# Patient Record
Sex: Female | Born: 1995 | Race: Black or African American | Hispanic: No | Marital: Single | Smoking: Never smoker
Health system: Southern US, Community
[De-identification: ages and names within clinical notes are randomized; demographics above are authoritative.]

## PROBLEM LIST (undated history)

## (undated) DIAGNOSIS — R87629 Unspecified abnormal cytological findings in specimens from vagina: Secondary | ICD-10-CM

## (undated) DIAGNOSIS — F32A Depression, unspecified: Secondary | ICD-10-CM

## (undated) DIAGNOSIS — B009 Herpesviral infection, unspecified: Secondary | ICD-10-CM

## (undated) DIAGNOSIS — F419 Anxiety disorder, unspecified: Secondary | ICD-10-CM

## (undated) DIAGNOSIS — F329 Major depressive disorder, single episode, unspecified: Secondary | ICD-10-CM

## (undated) DIAGNOSIS — Z8659 Personal history of other mental and behavioral disorders: Secondary | ICD-10-CM

## (undated) DIAGNOSIS — Z8742 Personal history of other diseases of the female genital tract: Secondary | ICD-10-CM

## (undated) HISTORY — DX: Unspecified abnormal cytological findings in specimens from vagina: R87.629

## (undated) HISTORY — PX: NO PAST SURGERIES: SHX2092

## (undated) HISTORY — DX: Depression, unspecified: F32.A

## (undated) HISTORY — DX: Anxiety disorder, unspecified: F41.9

---
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
## 2013-10-07 LAB — OB RESULTS CONSOLE RPR: RPR: NONREACTIVE

## 2013-10-07 LAB — OB RESULTS CONSOLE GC/CHLAMYDIA
Chlamydia: NEGATIVE
Gonorrhea: NEGATIVE

## 2013-10-07 LAB — OB RESULTS CONSOLE HIV ANTIBODY (ROUTINE TESTING): HIV: NONREACTIVE

## 2013-10-07 LAB — OB RESULTS CONSOLE HEPATITIS B SURFACE ANTIGEN: HEP B S AG: NEGATIVE

## 2013-10-07 LAB — OB RESULTS CONSOLE ABO/RH: RH TYPE: POSITIVE

## 2013-10-07 LAB — OB RESULTS CONSOLE ANTIBODY SCREEN: Antibody Screen: NEGATIVE

## 2013-10-07 LAB — OB RESULTS CONSOLE RUBELLA ANTIBODY, IGM: Rubella: IMMUNE

## 2014-01-25 ENCOUNTER — Inpatient Hospital Stay (HOSPITAL_COMMUNITY): Admission: AD | Admit: 2014-01-25 | Payer: Self-pay | Source: Ambulatory Visit | Admitting: Obstetrics & Gynecology

## 2014-04-17 LAB — OB RESULTS CONSOLE GBS: GBS: NEGATIVE

## 2014-05-07 ENCOUNTER — Encounter (HOSPITAL_COMMUNITY): Payer: Self-pay | Admitting: *Deleted

## 2014-05-07 ENCOUNTER — Inpatient Hospital Stay (HOSPITAL_COMMUNITY): Payer: BLUE CROSS/BLUE SHIELD | Admitting: Anesthesiology

## 2014-05-07 ENCOUNTER — Inpatient Hospital Stay (HOSPITAL_COMMUNITY)
Admission: AD | Admit: 2014-05-07 | Discharge: 2014-05-09 | DRG: 775 | Disposition: A | Discharge status: Home / Self Care | Payer: BLUE CROSS/BLUE SHIELD | Source: Ambulatory Visit | Attending: Obstetrics and Gynecology | Admitting: Obstetrics and Gynecology

## 2014-05-07 DIAGNOSIS — O9989 Other specified diseases and conditions complicating pregnancy, childbirth and the puerperium: Secondary | ICD-10-CM | POA: Diagnosis present

## 2014-05-07 DIAGNOSIS — Z3A39 39 weeks gestation of pregnancy: Secondary | ICD-10-CM | POA: Diagnosis present

## 2014-05-07 DIAGNOSIS — O4290 Premature rupture of membranes, unspecified as to length of time between rupture and onset of labor, unspecified weeks of gestation: Secondary | ICD-10-CM | POA: Diagnosis present

## 2014-05-07 HISTORY — DX: Personal history of other diseases of the female genital tract: Z87.42

## 2014-05-07 HISTORY — DX: Personal history of other mental and behavioral disorders: Z86.59

## 2014-05-07 HISTORY — DX: Herpesviral infection, unspecified: B00.9

## 2014-05-07 LAB — CBC
HCT: 36.3 % (ref 36.0–46.0)
Hemoglobin: 12.3 g/dL (ref 12.0–15.0)
MCH: 30.7 pg (ref 26.0–34.0)
MCHC: 33.9 g/dL (ref 30.0–36.0)
MCV: 90.5 fL (ref 78.0–100.0)
Platelets: 171 10*3/uL (ref 150–400)
RBC: 4.01 MIL/uL (ref 3.87–5.11)
RDW: 14 % (ref 11.5–15.5)
WBC: 7.8 10*3/uL (ref 4.0–10.5)

## 2014-05-07 LAB — TYPE AND SCREEN
ABO/RH(D): O POS
Antibody Screen: NEGATIVE

## 2014-05-07 LAB — ABO/RH: ABO/RH(D): O POS

## 2014-05-07 MED ORDER — CITRIC ACID-SODIUM CITRATE 334-500 MG/5ML PO SOLN: 30.0000 mL | ORAL | Status: DC | PRN

## 2014-05-07 MED ORDER — ACETAMINOPHEN 325 MG PO TABS: 650.0000 mg | ORAL_TABLET | ORAL | Status: DC | PRN

## 2014-05-07 MED ORDER — PHENYLEPHRINE 40 MCG/ML (10ML) SYRINGE FOR IV PUSH (FOR BLOOD PRESSURE SUPPORT)
80.0000 ug | PREFILLED_SYRINGE | INTRAVENOUS | Status: DC | PRN
Start: 2014-05-07 — End: 2014-05-08
  Filled 2014-05-07: qty 20
  Filled 2014-05-07: qty 2

## 2014-05-07 MED ORDER — EPHEDRINE 5 MG/ML INJ
10.0000 mg | INTRAVENOUS | Status: DC | PRN
  Filled 2014-05-07: qty 2

## 2014-05-07 MED ORDER — DIPHENHYDRAMINE HCL 50 MG/ML IJ SOLN: 12.5000 mg | INTRAMUSCULAR | Status: DC | PRN

## 2014-05-07 MED ORDER — ONDANSETRON HCL 4 MG/2ML IJ SOLN: 4.0000 mg | Freq: Four times a day (QID) | INTRAMUSCULAR | Status: DC | PRN

## 2014-05-07 MED ORDER — OXYCODONE-ACETAMINOPHEN 5-325 MG PO TABS: 2.0000 | ORAL_TABLET | ORAL | Status: DC | PRN

## 2014-05-07 MED ORDER — OXYTOCIN BOLUS FROM INFUSION: 500.0000 mL | INTRAVENOUS | Status: DC

## 2014-05-07 MED ORDER — LIDOCAINE HCL (PF) 1 % IJ SOLN
30.0000 mL | INTRAMUSCULAR | Status: DC | PRN
  Filled 2014-05-07: qty 30

## 2014-05-07 MED ORDER — OXYCODONE-ACETAMINOPHEN 5-325 MG PO TABS: 1.0000 | ORAL_TABLET | ORAL | Status: DC | PRN

## 2014-05-07 MED ORDER — FENTANYL 2.5 MCG/ML BUPIVACAINE 1/10 % EPIDURAL INFUSION (WH - ANES)
INTRAMUSCULAR | Status: DC | PRN
  Administered 2014-05-07: 14 mL/h via EPIDURAL

## 2014-05-07 MED ORDER — LACTATED RINGERS IV SOLN: 500.0000 mL | Freq: Once | INTRAVENOUS | Status: DC

## 2014-05-07 MED ORDER — PHENYLEPHRINE 40 MCG/ML (10ML) SYRINGE FOR IV PUSH (FOR BLOOD PRESSURE SUPPORT)
80.0000 ug | PREFILLED_SYRINGE | INTRAVENOUS | Status: DC | PRN
  Filled 2014-05-07: qty 2

## 2014-05-07 MED ORDER — FENTANYL 2.5 MCG/ML BUPIVACAINE 1/10 % EPIDURAL INFUSION (WH - ANES)
14.0000 mL/h | INTRAMUSCULAR | Status: DC | PRN
  Administered 2014-05-07: 14 mL/h via EPIDURAL
  Filled 2014-05-07: qty 125

## 2014-05-07 MED ORDER — LACTATED RINGERS IV SOLN
500.0000 mL | INTRAVENOUS | Status: DC | PRN
  Administered 2014-05-07 (×2): 500 mL via INTRAVENOUS

## 2014-05-07 MED ORDER — OXYTOCIN 40 UNITS IN LACTATED RINGERS INFUSION - SIMPLE MED
62.5000 mL/h | INTRAVENOUS | Status: DC
  Administered 2014-05-08: 62.5 mL/h via INTRAVENOUS
  Filled 2014-05-07: qty 1000

## 2014-05-07 MED ORDER — LIDOCAINE HCL (PF) 1 % IJ SOLN
INTRAMUSCULAR | Status: DC | PRN
  Administered 2014-05-07 (×2): 8 mL

## 2014-05-07 MED ORDER — BUTORPHANOL TARTRATE 1 MG/ML IJ SOLN
1.0000 mg | INTRAMUSCULAR | Status: DC | PRN
  Administered 2014-05-07: 1 mg via INTRAVENOUS
  Filled 2014-05-07: qty 1

## 2014-05-07 MED ORDER — LACTATED RINGERS IV SOLN
INTRAVENOUS | Status: DC
  Administered 2014-05-07: 18:00:00 via INTRAVENOUS

## 2014-05-07 NOTE — Anesthesia Procedure Notes (Signed)
Epidural Patient location during procedure: OB Start time: 05/07/2014 9:23 PM End time: 05/07/2014 9:27 PM  Staffing Anesthesiologist: Leilani AbleHATCHETT, Kevron Patella Performed by: anesthesiologist   Preanesthetic Checklist Completed: patient identified, surgical consent, pre-op evaluation, timeout performed, IV checked, risks and benefits discussed and monitors and equipment checked  Epidural Patient position: sitting Prep: site prepped and draped and DuraPrep Patient monitoring: continuous pulse ox and blood pressure Approach: midline Location: L3-L4 Injection technique: LOR air  Needle:  Needle type: Tuohy  Needle gauge: 17 G Needle length: 9 cm and 9 Needle insertion depth: 6 cm Catheter type: closed end flexible Catheter size: 19 Gauge Catheter at skin depth: 11 cm Test dose: negative and Other  Assessment Sensory level: T9 Events: blood not aspirated, injection not painful, no injection resistance, negative IV test and no paresthesia  Additional Notes Reason for block:procedure for pain

## 2014-05-07 NOTE — MAU Note (Signed)
Pt reports her water broke around 1:30 pm today clear fluid. Denies contractions and reports good fetal movement.

## 2014-05-07 NOTE — Anesthesia Preprocedure Evaluation (Signed)

## 2014-05-08 ENCOUNTER — Encounter (HOSPITAL_COMMUNITY): Payer: Self-pay | Admitting: General Practice

## 2014-05-08 LAB — CBC
HCT: 33.7 % — ABNORMAL LOW (ref 36.0–46.0)
Hemoglobin: 11.4 g/dL — ABNORMAL LOW (ref 12.0–15.0)
MCH: 30.5 pg (ref 26.0–34.0)
MCHC: 33.8 g/dL (ref 30.0–36.0)
MCV: 90.1 fL (ref 78.0–100.0)
Platelets: 147 10*3/uL — ABNORMAL LOW (ref 150–400)
RBC: 3.74 MIL/uL — ABNORMAL LOW (ref 3.87–5.11)
RDW: 14.1 % (ref 11.5–15.5)
WBC: 18.4 10*3/uL — AB (ref 4.0–10.5)

## 2014-05-08 LAB — RPR: RPR Ser Ql: NONREACTIVE

## 2014-05-08 MED ORDER — SIMETHICONE 80 MG PO CHEW: 80.0000 mg | CHEWABLE_TABLET | ORAL | Status: DC | PRN

## 2014-05-08 MED ORDER — OXYCODONE-ACETAMINOPHEN 5-325 MG PO TABS
1.0000 | ORAL_TABLET | ORAL | Status: DC | PRN
  Administered 2014-05-08 – 2014-05-09 (×4): 1 via ORAL
  Filled 2014-05-08 (×4): qty 1

## 2014-05-08 MED ORDER — MEDROXYPROGESTERONE ACETATE 150 MG/ML IM SUSP
150.0000 mg | INTRAMUSCULAR | Status: DC | PRN
Start: 2014-05-08 — End: 2014-05-09

## 2014-05-08 MED ORDER — IBUPROFEN 600 MG PO TABS
600.0000 mg | ORAL_TABLET | Freq: Four times a day (QID) | ORAL | Status: DC
  Administered 2014-05-08 – 2014-05-09 (×7): 600 mg via ORAL
  Filled 2014-05-08 (×7): qty 1

## 2014-05-08 MED ORDER — SENNOSIDES-DOCUSATE SODIUM 8.6-50 MG PO TABS
2.0000 | ORAL_TABLET | ORAL | Status: DC
  Administered 2014-05-08: 2 via ORAL
  Filled 2014-05-08: qty 2

## 2014-05-08 MED ORDER — BENZOCAINE-MENTHOL 20-0.5 % EX AERO
1.0000 "application " | INHALATION_SPRAY | CUTANEOUS | Status: DC | PRN
  Administered 2014-05-08: 1 via TOPICAL
  Filled 2014-05-08: qty 56

## 2014-05-08 MED ORDER — ONDANSETRON HCL 4 MG PO TABS: 4.0000 mg | ORAL_TABLET | ORAL | Status: DC | PRN

## 2014-05-08 MED ORDER — TETANUS-DIPHTH-ACELL PERTUSSIS 5-2.5-18.5 LF-MCG/0.5 IM SUSP: 0.5000 mL | Freq: Once | INTRAMUSCULAR | Status: DC

## 2014-05-08 MED ORDER — LANOLIN HYDROUS EX OINT
TOPICAL_OINTMENT | CUTANEOUS | Status: DC | PRN
Start: 2014-05-08 — End: 2014-05-09

## 2014-05-08 MED ORDER — MEASLES, MUMPS & RUBELLA VAC ~~LOC~~ INJ
0.5000 mL | INJECTION | Freq: Once | SUBCUTANEOUS | Status: DC
  Filled 2014-05-08: qty 0.5

## 2014-05-08 MED ORDER — PRENATAL MULTIVITAMIN CH
1.0000 | ORAL_TABLET | Freq: Every day | ORAL | Status: DC
  Administered 2014-05-08 – 2014-05-09 (×2): 1 via ORAL
  Filled 2014-05-08 (×2): qty 1

## 2014-05-08 MED ORDER — OXYCODONE-ACETAMINOPHEN 5-325 MG PO TABS: 2.0000 | ORAL_TABLET | ORAL | Status: DC | PRN

## 2014-05-08 MED ORDER — DIBUCAINE 1 % RE OINT: 1.0000 "application " | TOPICAL_OINTMENT | RECTAL | Status: DC | PRN

## 2014-05-08 MED ORDER — WITCH HAZEL-GLYCERIN EX PADS: 1.0000 "application " | MEDICATED_PAD | CUTANEOUS | Status: DC | PRN

## 2014-05-08 MED ORDER — DIPHENHYDRAMINE HCL 25 MG PO CAPS: 25.0000 mg | ORAL_CAPSULE | Freq: Four times a day (QID) | ORAL | Status: DC | PRN

## 2014-05-08 MED ORDER — ONDANSETRON HCL 4 MG/2ML IJ SOLN: 4.0000 mg | INTRAMUSCULAR | Status: DC | PRN

## 2014-05-08 NOTE — Progress Notes (Signed)
Post Partum Day 0 Subjective: no complaints, up ad lib, voiding and tolerating PO  Objective: Blood pressure 99/57, pulse 106, temperature 98.3 F (36.8 C), temperature source Oral, resp. rate 18, height 5\' 1"  (1.549 m), weight 147 lb (66.679 kg), SpO2 100 %, unknown if currently breastfeeding.  Physical Exam:  General: alert and cooperative Lochia: appropriate Uterine Fundus: firm Incision: healing well DVT Evaluation: No evidence of DVT seen on physical exam. Negative Homan's sign. No cords or calf tenderness. No significant calf/ankle edema.   Recent Labs  05/07/14 1445 05/08/14 0600  HGB 12.3 11.4*  HCT 36.3 33.7*    Assessment/Plan: Plan for discharge tomorrow   LOS: 1 day   CURTIS,CAROL G 05/08/2014, 8:00 AM

## 2014-05-08 NOTE — Progress Notes (Signed)
SVD of vigerous female infant w/ apgars of 9,9.  Placenta delivered spontaneous w/ 3VC.   1st degree & right labial lac repaired w/ 3-0 vicryl rapide.  Fundus firm.  EBL 300cc .

## 2014-05-08 NOTE — H&P (Signed)
Elisha HeadlandJaquana Miller is a 19 y.o. female presenting for SROM, clear fluid.  Having regular ctx.  No vb.  Pregnancy uncomplicated.  History OB History    Gravida Para Term Preterm AB TAB SAB Ectopic Multiple Living   1         0     History reviewed. No pertinent past medical history. History reviewed. No pertinent past surgical history. Family History: family history is not on file. Social History:  reports that she has never smoked. She does not have any smokeless tobacco history on file. She reports that she does not use illicit drugs. Her alcohol history is not on file.   Prenatal Transfer Tool  Maternal Diabetes: No Genetic Screening: Normal Maternal Ultrasounds/Referrals: Normal Fetal Ultrasounds or other Referrals:  None Maternal Substance Abuse:  No Significant Maternal Medications:  None Significant Maternal Lab Results:  None Other Comments:  None  ROS  Dilation: 10 Effacement (%): 100 Station: +2 Exam by:: Lucas MallowKlashley, RN Blood pressure 115/52, pulse 105, temperature 98.3 F (36.8 C), temperature source Oral, resp. rate 16, height 5\' 1"  (1.549 m), weight 147 lb (66.679 kg), SpO2 100 %. Exam Physical Exam  Prenatal labs: ABO, Rh: --/--/O POS, O POS (02/25 1445) Antibody: NEG (02/25 1445) Rubella: Immune (07/28 0000) RPR: Nonreactive (07/28 0000)  HBsAg: Negative (07/28 0000)  HIV: Non-reactive (07/28 0000)  GBS: Negative (02/05 0000)   Assessment/Plan: SROM/Labor Exp mngt Epidural prn   Draken Farrior 05/08/2014, 12:54 AM

## 2014-05-08 NOTE — Progress Notes (Signed)
UR chart review completed.  

## 2014-05-08 NOTE — Anesthesia Postprocedure Evaluation (Signed)
  Anesthesia Post-op Note  Patient: Katherine Nunez  Procedure(s) Performed: * No procedures listed *  Patient Location: Mother/Baby  Anesthesia Type:Epidural  Level of Consciousness: awake, alert , oriented and patient cooperative  Airway and Oxygen Therapy: Patient Spontanous Breathing  Post-op Pain: none  Post-op Assessment: Post-op Vital signs reviewed, Patient's Cardiovascular Status Stable, Respiratory Function Stable, Patent Airway, No signs of Nausea or vomiting, Adequate PO intake, Pain level controlled, No headache, No backache, No residual numbness and No residual motor weakness  Post-op Vital Signs: Reviewed and stable  Last Vitals:  Filed Vitals:   05/08/14 0424  BP: 99/57  Pulse: 106  Temp: 36.8 C  Resp: 18    Complications: No apparent anesthesia complications

## 2014-05-09 MED ORDER — OXYCODONE-ACETAMINOPHEN 7.5-325 MG PO TABS: 1.0000 | ORAL_TABLET | ORAL | Status: DC | PRN

## 2014-05-09 NOTE — Discharge Summary (Signed)
Obstetric Discharge Summary Reason for Admission: rupture of membranes Prenatal Procedures: none Intrapartum Procedures: spontaneous vaginal delivery Postpartum Procedures: none Complications-Operative and Postpartum: first degree perineal laceration HEMOGLOBIN  Date Value Ref Range Status  05/08/2014 11.4* 12.0 - 15.0 g/dL Final   HCT  Date Value Ref Range Status  05/08/2014 33.7* 36.0 - 46.0 % Final    Physical Exam:  General: alert Lochia: appropriate Uterine Fundus: firm Incision: healing well DVT Evaluation: No evidence of DVT seen on physical exam.  Discharge Diagnoses: Term Pregnancy-delivered  Discharge Information: Date: 05/09/2014 Activity: pelvic rest Diet: routine Medications: PNV and Percocet Condition: stable Instructions: refer to practice specific booklet Discharge to: home   Newborn Data: Live born female  Birth Weight: 6 lb 15.5 oz (3160 g) APGAR: 9, 9  Home with mother.  Luna Audia S 05/09/2014, 9:03 AM

## 2014-05-09 NOTE — Progress Notes (Signed)
Clinical Social Work Department PSYCHOSOCIAL ASSESSMENT - MATERNAL/CHILD 05/09/2014  Patient:  Katherine Nunez,Katherine Nunez  Account Number:  [id number]  Admit Date:  05/07/2014  Childs Name:   Katherine Nunez    Clinical Social Worker:  Lavonia Eager, LCSW   Date/Time:  05/09/2014 12:30 PM  Date Referred:  05/08/2014   Referral source  Central Nursery     Referred reason  Panic attacks   Other referral source:    I:  FAMILY / HOME ENVIRONMENT Child's legal guardian:  PARENT  Guardian - Name Guardian - Age Guardian - Address  Katherine Nunez,Katherine Nunez 18 [address]  [place], [place] [address]  Katherine Nunez, Katherine Nunez 22 same as above   Other household support members/support persons Other support:   Grand parents    II  PSYCHOSOCIAL DATA Information Source:    Financial and Community Resources Employment:   Both parents are employed   Financial resources:  Private Insurance If Medicaid - County:    School / Grade:   Maternity Care Coordinator / Child Services Coordination / Early Interventions:  Cultural issues impacting care:    III  STRENGTHS Strengths  Supportive family/friends  Home prepared for Child (including basic supplies)  Adequate Resources   Strength comment:    IV  RISK FACTORS AND CURRENT PROBLEMS Current Problem:     Risk Factor & Current Problem Patient Issue Family Issue Risk Factor / Current Problem Comment   N N     V  SOCIAL WORK ASSESSMENT Acknowledged order for social work consult to assess mother's hx of panic attacks.   Met with both parents. They were pleasant and receptive to social work intervention.   Parents not married but reside together. FOB was very attentive to newborn during social work visit. Mother reports hx of anxiety and states that she was prescribed medication, but did not take any during pregnancy and rarely needed to take the medication prior to pregnancy.  She denies current symptoms of depression or anxiety.     She denies any hx of illicit drug use.    No acute social concerns noted or reported at this time. Mother informed of social work availability.      VI SOCIAL WORK PLAN Social Work Plan  No Further Intervention Required / No Barriers to Discharge   Type of pt/family education:   PP Depression information and resources   If child protective services report - county:   If child protective services report - date:   Information/referral to community resources comment:   Other social work plan:     

## 2014-05-13 ENCOUNTER — Inpatient Hospital Stay (HOSPITAL_COMMUNITY): Admission: RE | Admit: 2014-05-13 | Payer: BLUE CROSS/BLUE SHIELD | Source: Ambulatory Visit

## 2018-01-15 LAB — OB RESULTS CONSOLE ABO/RH: RH Type: POSITIVE

## 2018-01-15 LAB — OB RESULTS CONSOLE RUBELLA ANTIBODY, IGM: Rubella: IMMUNE

## 2018-01-15 LAB — OB RESULTS CONSOLE GC/CHLAMYDIA
Chlamydia: NEGATIVE
Gonorrhea: NEGATIVE

## 2018-01-15 LAB — OB RESULTS CONSOLE ANTIBODY SCREEN: Antibody Screen: NEGATIVE

## 2018-01-15 LAB — OB RESULTS CONSOLE HIV ANTIBODY (ROUTINE TESTING): HIV: NONREACTIVE

## 2018-01-15 LAB — OB RESULTS CONSOLE RPR: RPR: NONREACTIVE

## 2018-01-15 LAB — OB RESULTS CONSOLE HEPATITIS B SURFACE ANTIGEN: Hepatitis B Surface Ag: NEGATIVE

## 2018-08-15 ENCOUNTER — Encounter (HOSPITAL_COMMUNITY): Payer: Self-pay | Admitting: *Deleted

## 2018-08-15 ENCOUNTER — Telehealth (HOSPITAL_COMMUNITY): Payer: Self-pay | Admitting: *Deleted

## 2018-08-15 NOTE — Telephone Encounter (Signed)
Preadmission screen  

## 2018-08-17 ENCOUNTER — Other Ambulatory Visit: Payer: Self-pay

## 2018-08-17 ENCOUNTER — Encounter (HOSPITAL_COMMUNITY): Payer: Self-pay

## 2018-08-17 ENCOUNTER — Inpatient Hospital Stay (HOSPITAL_COMMUNITY)
Admission: AD | Admit: 2018-08-17 | Discharge: 2018-08-18 | Disposition: A | Discharge status: Home / Self Care | Payer: 59 | Attending: Obstetrics and Gynecology | Admitting: Obstetrics and Gynecology

## 2018-08-17 DIAGNOSIS — B9689 Other specified bacterial agents as the cause of diseases classified elsewhere: Secondary | ICD-10-CM | POA: Diagnosis not present

## 2018-08-17 DIAGNOSIS — Z3A38 38 weeks gestation of pregnancy: Secondary | ICD-10-CM

## 2018-08-17 DIAGNOSIS — O26892 Other specified pregnancy related conditions, second trimester: Secondary | ICD-10-CM | POA: Diagnosis not present

## 2018-08-17 DIAGNOSIS — O23593 Infection of other part of genital tract in pregnancy, third trimester: Secondary | ICD-10-CM | POA: Diagnosis not present

## 2018-08-17 DIAGNOSIS — O26893 Other specified pregnancy related conditions, third trimester: Secondary | ICD-10-CM | POA: Diagnosis present

## 2018-08-17 DIAGNOSIS — Z0371 Encounter for suspected problem with amniotic cavity and membrane ruled out: Secondary | ICD-10-CM | POA: Diagnosis not present

## 2018-08-17 LAB — WET PREP, GENITAL
Sperm: NONE SEEN
Trich, Wet Prep: NONE SEEN
Yeast Wet Prep HPF POC: NONE SEEN

## 2018-08-17 LAB — POCT FERN TEST: POCT Fern Test: NEGATIVE

## 2018-08-17 NOTE — MAU Note (Signed)
Pt has been having cntrx about an hour apart.  Has been leaking fluid since Thursday. She was 3 cm on Wednesday. Denies bleeding. +FM

## 2018-08-17 NOTE — MAU Provider Note (Signed)
None      S: Ms. Katherine Nunez is a 23 y.o. G2P1001 at [redacted]w[redacted]d  who presents to MAU today complaining of leaking of fluid since Thursday.  Patient states she has not been wearing a pad, but has been changing her underwear throughout the day.  She denies vaginal bleeding. She endorses contractions. She reports normal fetal movement.    O: BP 110/66 (BP Location: Right Arm)   Pulse (!) 109   Temp 98.4 F (36.9 C) (Oral)   Ht 5\' 2"  (1.575 m)   Wt 70.7 kg   LMP 11/20/2017   SpO2 99%   BMI 28.50 kg/m  GENERAL: Well-developed, well-nourished female in no acute distress.  HEAD: Normocephalic, atraumatic.  CHEST: Normal effort of breathing, regular heart rate ABDOMEN: Soft, nontender, gravid PELVIC: Normal external female genitalia. Vagina is pink and rugated. Cervix difficult to visualize s/t position and mucoid discharge.  + pooling of grayish discharge noted in posterior fornix. Fern Collected  Cervical exam:  Dilation: 1.5 Effacement (%): 10 Cervical Position: Posterior Station: (indeterminate) Exam by:: TLytle Rn    Fetal Monitoring: FHT: 145 bpm, Mod Var, -Decels, +Accels Toco: Q1-39min  Results for orders placed or performed during the hospital encounter of 08/17/18 (from the past 24 hour(s))  POCT fern test     Status: Normal   Collection Time: 08/17/18 10:49 PM  Result Value Ref Range   POCT Fern Test Negative = intact amniotic membranes   Wet prep, genital     Status: Abnormal   Collection Time: 08/17/18 11:41 PM  Result Value Ref Range   Yeast Wet Prep HPF POC NONE SEEN NONE SEEN   Trich, Wet Prep NONE SEEN NONE SEEN   Clue Cells Wet Prep HPF POC PRESENT (A) NONE SEEN   WBC, Wet Prep HPF POC MODERATE (A) NONE SEEN   Sperm NONE SEEN      A: SIUP at [redacted]w[redacted]d  Membranes intact  P: -Fern Negative -Wet prep collected and sent. -NST Reactive -Will reassess  Follow Up (1:08 AM) Bacterial Vaginosis Dilation: 1.5 Effacement (%): 50 Cervical Position:  Posterior Station: Ballotable Presentation: Vertex Exam by:: Frances Maywood RN   -Wet prep returns significant for clue cells. -Cervical exam remains the same. -Results discussed with patient. -Rx for Metronidazole sent to pharmacy on file.  -Patient informed that regime would not be completed prior to scheduled IOL. -Informed that this would not have an effect on delivery. -Patient without questions or concerns. -Labor Precautions given. -Encouraged to call or return to MAU if symptoms worsen or with the onset of new symptoms. -Discharged to home in stable condition.  Katherine Nunez, CNM 08/17/2018 11:16 PM

## 2018-08-18 DIAGNOSIS — Z3A38 38 weeks gestation of pregnancy: Secondary | ICD-10-CM

## 2018-08-18 DIAGNOSIS — O26893 Other specified pregnancy related conditions, third trimester: Secondary | ICD-10-CM

## 2018-08-18 DIAGNOSIS — O23593 Infection of other part of genital tract in pregnancy, third trimester: Secondary | ICD-10-CM | POA: Diagnosis not present

## 2018-08-18 DIAGNOSIS — Z0371 Encounter for suspected problem with amniotic cavity and membrane ruled out: Secondary | ICD-10-CM

## 2018-08-18 DIAGNOSIS — B9689 Other specified bacterial agents as the cause of diseases classified elsewhere: Secondary | ICD-10-CM

## 2018-08-18 MED ORDER — METRONIDAZOLE 500 MG PO TABS: 500.0000 mg | ORAL_TABLET | Freq: Two times a day (BID) | ORAL | 0 refills | Status: DC

## 2018-08-18 NOTE — MAU Note (Signed)
I have communicated with  Milinda Cave, CNM and reviewed vital signs:  Vitals:   08/17/18 2315 08/18/18 0113  BP:  112/73  Pulse:    Temp:    SpO2: 99%     Vaginal exam:  Dilation: 1.5 Effacement (%): 50 Cervical Position: Posterior Station: Ballotable Presentation: Vertex Exam by:: Frances Maywood RN,   Also reviewed contraction pattern and that non-stress test is reactive.  It has been documented that patient is contracting every 2-6 minutes with minimal  cervical change over 1.25 hours not indicating active labor.  Patient denies any other complaints.  Based on this report provider has given order for discharge.  A discharge order and diagnosis entered by a provider.   Labor discharge instructions reviewed with patient.

## 2018-08-18 NOTE — Discharge Instructions (Signed)
Bacterial Vaginosis    Bacterial vaginosis is a vaginal infection that occurs when the normal balance of bacteria in the vagina is disrupted. It results from an overgrowth of certain bacteria. This is the most common vaginal infection among women ages 15-44.  Because bacterial vaginosis increases your risk for STIs (sexually transmitted infections), getting treated can help reduce your risk for chlamydia, gonorrhea, herpes, and HIV (human immunodeficiency virus). Treatment is also important for preventing complications in pregnant women, because this condition can cause an early (premature) delivery.  What are the causes?  This condition is caused by an increase in harmful bacteria that are normally present in small amounts in the vagina. However, the reason that the condition develops is not fully understood.  What increases the risk?  The following factors may make you more likely to develop this condition:  · Having a new sexual partner or multiple sexual partners.  · Having unprotected sex.  · Douching.  · Having an intrauterine device (IUD).  · Smoking.  · Drug and alcohol abuse.  · Taking certain antibiotic medicines.  · Being pregnant.  You cannot get bacterial vaginosis from toilet seats, bedding, swimming pools, or contact with objects around you.  What are the signs or symptoms?  Symptoms of this condition include:  · Grey or white vaginal discharge. The discharge can also be watery or foamy.  · A fish-like odor with discharge, especially after sexual intercourse or during menstruation.  · Itching in and around the vagina.  · Burning or pain with urination.  Some women with bacterial vaginosis have no signs or symptoms.  How is this diagnosed?  This condition is diagnosed based on:  · Your medical history.  · A physical exam of the vagina.  · Testing a sample of vaginal fluid under a microscope to look for a large amount of bad bacteria or abnormal cells. Your health care provider may use a cotton swab or  a small wooden spatula to collect the sample.  How is this treated?  This condition is treated with antibiotics. These may be given as a pill, a vaginal cream, or a medicine that is put into the vagina (suppository). If the condition comes back after treatment, a second round of antibiotics may be needed.  Follow these instructions at home:  Medicines  · Take over-the-counter and prescription medicines only as told by your health care provider.  · Take or use your antibiotic as told by your health care provider. Do not stop taking or using the antibiotic even if you start to feel better.  General instructions  · If you have a female sexual partner, tell her that you have a vaginal infection. She should see her health care provider and be treated if she has symptoms. If you have a female sexual partner, he does not need treatment.  · During treatment:  ? Avoid sexual activity until you finish treatment.  ? Do not douche.  ? Avoid alcohol as directed by your health care provider.  ? Avoid breastfeeding as directed by your health care provider.  · Drink enough water and fluids to keep your urine clear or pale yellow.  · Keep the area around your vagina and rectum clean.  ? Wash the area daily with warm water.  ? Wipe yourself from front to back after using the toilet.  · Keep all follow-up visits as told by your health care provider. This is important.  How is this prevented?  · Do not   douche.  · Wash the outside of your vagina with warm water only.  · Use protection when having sex. This includes latex condoms and dental dams.  · Limit how many sexual partners you have. To help prevent bacterial vaginosis, it is best to have sex with just one partner (monogamous).  · Make sure you and your sexual partner are tested for STIs.  · Wear cotton or cotton-lined underwear.  · Avoid wearing tight pants and pantyhose, especially during summer.  · Limit the amount of alcohol that you drink.  · Do not use any products that contain  nicotine or tobacco, such as cigarettes and e-cigarettes. If you need help quitting, ask your health care provider.  · Do not use illegal drugs.  Where to find more information  · Centers for Disease Control and Prevention: www.cdc.gov/std  · American Sexual Health Association (ASHA): www.ashastd.org  · U.S. Department of Health and Human Services, Office on Women's Health: www.womenshealth.gov/ or https://www.womenshealth.gov/a-z-topics/bacterial-vaginosis  Contact a health care provider if:  · Your symptoms do not improve, even after treatment.  · You have more discharge or pain when urinating.  · You have a fever.  · You have pain in your abdomen.  · You have pain during sex.  · You have vaginal bleeding between periods.  Summary  · Bacterial vaginosis is a vaginal infection that occurs when the normal balance of bacteria in the vagina is disrupted.  · Because bacterial vaginosis increases your risk for STIs (sexually transmitted infections), getting treated can help reduce your risk for chlamydia, gonorrhea, herpes, and HIV (human immunodeficiency virus). Treatment is also important for preventing complications in pregnant women, because the condition can cause an early (premature) delivery.  · This condition is treated with antibiotic medicines. These may be given as a pill, a vaginal cream, or a medicine that is put into the vagina (suppository).  This information is not intended to replace advice given to you by your health care provider. Make sure you discuss any questions you have with your health care provider.  Document Released: 02/27/2005 Document Revised: 07/03/2016 Document Reviewed: 11/13/2015  Elsevier Interactive Patient Education © 2019 [employer]

## 2018-08-20 ENCOUNTER — Other Ambulatory Visit (HOSPITAL_COMMUNITY)
Admission: RE | Admit: 2018-08-20 | Discharge: 2018-08-20 | Disposition: A | Discharge status: Home / Self Care | Payer: 59 | Source: Ambulatory Visit | Attending: Obstetrics and Gynecology | Admitting: Obstetrics and Gynecology

## 2018-08-20 ENCOUNTER — Other Ambulatory Visit: Payer: Self-pay

## 2018-08-20 DIAGNOSIS — Z1159 Encounter for screening for other viral diseases: Secondary | ICD-10-CM | POA: Diagnosis not present

## 2018-08-20 DIAGNOSIS — Z01812 Encounter for preprocedural laboratory examination: Secondary | ICD-10-CM | POA: Diagnosis present

## 2018-08-21 LAB — NOVEL CORONAVIRUS, NAA (HOSP ORDER, SEND-OUT TO REF LAB; TAT 18-24 HRS): SARS-CoV-2, NAA: NOT DETECTED

## 2018-08-21 NOTE — H&P (Signed)
Katherine Nunez is a 23 y.o. female presenting for eIOL. Pregnancy c/b [facility] that resolved. 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    OB History    Gravida  2   Para  1   Term  1   Preterm      AB      Living  1     SAB      TAB      Ectopic      Multiple  0   Live Births  1          Past Medical History:  Diagnosis Date  . Anxiety   . Depression   . History of abnormal cervical Pap smear   . History of anxiety   . History of panic attacks   . 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 Vaginal Pap smear, abnormal    Past Surgical History:  Procedure Laterality Date  . NO PAST SURGERIES     Family History: family history includes Diabetes in her father; Hypertension in her maternal grandfather and maternal grandmother. Social History:  reports that she has never smoked. She has never used smokeless tobacco. She reports previous alcohol use. She reports that she does not use drugs.     Maternal Diabetes: No Genetic Screening: Normal Maternal Ultrasounds/Referrals: Normal Fetal Ultrasounds or other Referrals:  None Maternal Substance Abuse:  No Significant Maternal Medications:  None Significant Maternal Lab Results:  None Other Comments:  None  ROS History   Last menstrual period 11/20/2017, unknown if currently breastfeeding. Exam Physical Exam  (from office) NAD, A&O NWOB Abd soft, nondistended, gravid  Prenatal labs: ABO, Rh: O/Positive/-- (11/05 0000) Antibody: Negative (11/05 0000) Rubella: Immune (11/05 0000) RPR: Nonreactive (11/05 0000)  HBsAg: Negative (11/05 0000)  HIV: Non-reactive (11/05 0000)  GBS:     Assessment/Plan: 23 yo G3P1011 @ 39.2wga presenting for eIOL.  Pitocin/AROM. Expecting another girl.  GBS negative.     Tyson Dense 08/21/2018, 12:29 PM

## 2018-08-22 ENCOUNTER — Other Ambulatory Visit: Payer: Self-pay

## 2018-08-22 ENCOUNTER — Inpatient Hospital Stay (HOSPITAL_COMMUNITY): Payer: 59

## 2018-08-22 ENCOUNTER — Encounter (HOSPITAL_COMMUNITY)
Admission: AD | Disposition: A | Discharge status: Home / Self Care | Payer: Self-pay | Source: Home / Self Care | Attending: Obstetrics and Gynecology

## 2018-08-22 ENCOUNTER — Inpatient Hospital Stay (HOSPITAL_COMMUNITY): Payer: 59 | Admitting: Anesthesiology

## 2018-08-22 ENCOUNTER — Inpatient Hospital Stay (HOSPITAL_COMMUNITY)
Admission: AD | Admit: 2018-08-22 | Discharge: 2018-08-24 | DRG: 788 | Disposition: A | Discharge status: Home / Self Care | Payer: 59 | Attending: Obstetrics and Gynecology | Admitting: Obstetrics and Gynecology

## 2018-08-22 ENCOUNTER — Encounter (HOSPITAL_COMMUNITY): Payer: Self-pay

## 2018-08-22 DIAGNOSIS — Z3490 Encounter for supervision of normal pregnancy, unspecified, unspecified trimester: Secondary | ICD-10-CM

## 2018-08-22 DIAGNOSIS — O26893 Other specified pregnancy related conditions, third trimester: Secondary | ICD-10-CM | POA: Diagnosis present

## 2018-08-22 DIAGNOSIS — Z3A39 39 weeks gestation of pregnancy: Secondary | ICD-10-CM | POA: Diagnosis not present

## 2018-08-22 LAB — CBC
HCT: 34.1 % — ABNORMAL LOW (ref 36.0–46.0)
Hemoglobin: 11.5 g/dL — ABNORMAL LOW (ref 12.0–15.0)
MCH: 30.5 pg (ref 26.0–34.0)
MCHC: 33.7 g/dL (ref 30.0–36.0)
MCV: 90.5 fL (ref 80.0–100.0)
Platelets: 172 10*3/uL (ref 150–400)
RBC: 3.77 MIL/uL — ABNORMAL LOW (ref 3.87–5.11)
RDW: 13.6 % (ref 11.5–15.5)
WBC: 7.3 10*3/uL (ref 4.0–10.5)
nRBC: 0 % (ref 0.0–0.2)

## 2018-08-22 LAB — TYPE AND SCREEN
ABO/RH(D): O POS
Antibody Screen: NEGATIVE

## 2018-08-22 LAB — RPR: RPR Ser Ql: NONREACTIVE

## 2018-08-22 LAB — ABO/RH: ABO/RH(D): O POS

## 2018-08-22 SURGERY — Surgical Case
Anesthesia: Epidural | Site: Abdomen | Wound class: Clean Contaminated

## 2018-08-22 MED ORDER — SODIUM CHLORIDE 0.9% FLUSH: 3.0000 mL | INTRAVENOUS | Status: DC | PRN

## 2018-08-22 MED ORDER — NALBUPHINE HCL 10 MG/ML IJ SOLN: 5.0000 mg | INTRAMUSCULAR | Status: DC | PRN

## 2018-08-22 MED ORDER — OXYCODONE-ACETAMINOPHEN 5-325 MG PO TABS: 1.0000 | ORAL_TABLET | ORAL | Status: DC | PRN

## 2018-08-22 MED ORDER — DIBUCAINE (PERIANAL) 1 % EX OINT: 1.0000 "application " | TOPICAL_OINTMENT | CUTANEOUS | Status: DC | PRN

## 2018-08-22 MED ORDER — COCONUT OIL OIL: 1.0000 "application " | TOPICAL_OIL | Status: DC | PRN

## 2018-08-22 MED ORDER — OXYTOCIN 40 UNITS IN NORMAL SALINE INFUSION - SIMPLE MED
INTRAVENOUS | Status: AC
  Filled 2018-08-22: qty 1000

## 2018-08-22 MED ORDER — KETOROLAC TROMETHAMINE 30 MG/ML IJ SOLN: 30.0000 mg | Freq: Four times a day (QID) | INTRAMUSCULAR | Status: DC | PRN

## 2018-08-22 MED ORDER — SIMETHICONE 80 MG PO CHEW
80.0000 mg | CHEWABLE_TABLET | ORAL | Status: DC
  Administered 2018-08-22 – 2018-08-23 (×2): 80 mg via ORAL
  Filled 2018-08-22 (×2): qty 1

## 2018-08-22 MED ORDER — LACTATED RINGERS IV SOLN
500.0000 mL | Freq: Once | INTRAVENOUS | Status: AC
  Administered 2018-08-22: 500 mL via INTRAVENOUS

## 2018-08-22 MED ORDER — NALOXONE HCL 4 MG/10ML IJ SOLN
1.0000 ug/kg/h | INTRAVENOUS | Status: DC | PRN
  Filled 2018-08-22: qty 5

## 2018-08-22 MED ORDER — NALBUPHINE HCL 10 MG/ML IJ SOLN: 5.0000 mg | Freq: Once | INTRAMUSCULAR | Status: DC | PRN

## 2018-08-22 MED ORDER — CEFAZOLIN SODIUM-DEXTROSE 2-4 GM/100ML-% IV SOLN
2.0000 g | Freq: Once | INTRAVENOUS | Status: AC
  Administered 2018-08-22: 2 g via INTRAVENOUS

## 2018-08-22 MED ORDER — OXYCODONE HCL 5 MG PO TABS
5.0000 mg | ORAL_TABLET | ORAL | Status: DC | PRN
  Administered 2018-08-23 – 2018-08-24 (×2): 5 mg via ORAL
  Administered 2018-08-24: 10 mg via ORAL
  Filled 2018-08-22 (×2): qty 1
  Filled 2018-08-22: qty 2

## 2018-08-22 MED ORDER — DEXAMETHASONE SODIUM PHOSPHATE 4 MG/ML IJ SOLN
INTRAMUSCULAR | Status: AC
  Filled 2018-08-22: qty 1

## 2018-08-22 MED ORDER — FENTANYL-BUPIVACAINE-NACL 0.5-0.125-0.9 MG/250ML-% EP SOLN
12.0000 mL/h | EPIDURAL | Status: DC | PRN
  Filled 2018-08-22: qty 250

## 2018-08-22 MED ORDER — OXYTOCIN BOLUS FROM INFUSION: 500.0000 mL | Freq: Once | INTRAVENOUS | Status: DC

## 2018-08-22 MED ORDER — DIPHENHYDRAMINE HCL 25 MG PO CAPS: 25.0000 mg | ORAL_CAPSULE | Freq: Four times a day (QID) | ORAL | Status: DC | PRN

## 2018-08-22 MED ORDER — SENNOSIDES-DOCUSATE SODIUM 8.6-50 MG PO TABS
2.0000 | ORAL_TABLET | ORAL | Status: DC
  Administered 2018-08-22 – 2018-08-23 (×2): 2 via ORAL
  Filled 2018-08-22 (×2): qty 2

## 2018-08-22 MED ORDER — FENTANYL CITRATE (PF) 100 MCG/2ML IJ SOLN
INTRAMUSCULAR | Status: AC
  Filled 2018-08-22: qty 2

## 2018-08-22 MED ORDER — PHENYLEPHRINE 40 MCG/ML (10ML) SYRINGE FOR IV PUSH (FOR BLOOD PRESSURE SUPPORT)
80.0000 ug | PREFILLED_SYRINGE | INTRAVENOUS | Status: DC | PRN
  Administered 2018-08-22: 80 ug via INTRAVENOUS

## 2018-08-22 MED ORDER — HYDROMORPHONE HCL 1 MG/ML IJ SOLN: 0.2000 mg | INTRAMUSCULAR | Status: DC | PRN

## 2018-08-22 MED ORDER — SOD CITRATE-CITRIC ACID 500-334 MG/5ML PO SOLN
30.0000 mL | ORAL | Status: DC | PRN
  Administered 2018-08-22 (×2): 30 mL via ORAL
  Filled 2018-08-22 (×2): qty 30

## 2018-08-22 MED ORDER — OXYTOCIN 10 UNIT/ML IJ SOLN
INTRAMUSCULAR | Status: DC | PRN
  Administered 2018-08-22: 40 [IU] via INTRAMUSCULAR

## 2018-08-22 MED ORDER — METOCLOPRAMIDE HCL 5 MG/ML IJ SOLN
INTRAMUSCULAR | Status: AC
  Filled 2018-08-22: qty 2

## 2018-08-22 MED ORDER — OXYTOCIN 40 UNITS IN NORMAL SALINE INFUSION - SIMPLE MED
1.0000 m[IU]/min | INTRAVENOUS | Status: DC
  Administered 2018-08-22: 2 m[IU]/min via INTRAVENOUS

## 2018-08-22 MED ORDER — LIDOCAINE-EPINEPHRINE (PF) 2 %-1:200000 IJ SOLN
INTRAMUSCULAR | Status: AC
  Filled 2018-08-22: qty 10

## 2018-08-22 MED ORDER — LACTATED RINGERS IV SOLN
500.0000 mL | INTRAVENOUS | Status: DC | PRN
  Administered 2018-08-22 (×2): 500 mL via INTRAVENOUS

## 2018-08-22 MED ORDER — MENTHOL 3 MG MT LOZG: 1.0000 | LOZENGE | OROMUCOSAL | Status: DC | PRN

## 2018-08-22 MED ORDER — DIPHENHYDRAMINE HCL 50 MG/ML IJ SOLN: 12.5000 mg | INTRAMUSCULAR | Status: DC | PRN

## 2018-08-22 MED ORDER — SODIUM CHLORIDE (PF) 0.9 % IJ SOLN
INTRAMUSCULAR | Status: DC | PRN
  Administered 2018-08-22: 14 mL/h via EPIDURAL

## 2018-08-22 MED ORDER — SIMETHICONE 80 MG PO CHEW: 80.0000 mg | CHEWABLE_TABLET | ORAL | Status: DC | PRN

## 2018-08-22 MED ORDER — MEPERIDINE HCL 25 MG/ML IJ SOLN: 6.2500 mg | INTRAMUSCULAR | Status: DC | PRN

## 2018-08-22 MED ORDER — ONDANSETRON HCL 4 MG/2ML IJ SOLN: 4.0000 mg | Freq: Three times a day (TID) | INTRAMUSCULAR | Status: DC | PRN

## 2018-08-22 MED ORDER — LACTATED RINGERS IV SOLN
INTRAVENOUS | Status: DC
  Administered 2018-08-22 (×2): via INTRAVENOUS

## 2018-08-22 MED ORDER — PRENATAL MULTIVITAMIN CH
1.0000 | ORAL_TABLET | Freq: Every day | ORAL | Status: DC
  Administered 2018-08-23 – 2018-08-24 (×2): 1 via ORAL
  Filled 2018-08-22 (×2): qty 1

## 2018-08-22 MED ORDER — WITCH HAZEL-GLYCERIN EX PADS: 1.0000 "application " | MEDICATED_PAD | CUTANEOUS | Status: DC | PRN

## 2018-08-22 MED ORDER — ACETAMINOPHEN 500 MG PO TABS
1000.0000 mg | ORAL_TABLET | Freq: Four times a day (QID) | ORAL | Status: DC
  Administered 2018-08-22 – 2018-08-24 (×7): 1000 mg via ORAL
  Filled 2018-08-22 (×7): qty 2

## 2018-08-22 MED ORDER — NALOXONE HCL 0.4 MG/ML IJ SOLN: 0.4000 mg | INTRAMUSCULAR | Status: DC | PRN

## 2018-08-22 MED ORDER — ONDANSETRON HCL 4 MG/2ML IJ SOLN
INTRAMUSCULAR | Status: DC | PRN
  Administered 2018-08-22: 4 mg via INTRAVENOUS

## 2018-08-22 MED ORDER — ACETAMINOPHEN 325 MG PO TABS
650.0000 mg | ORAL_TABLET | ORAL | Status: DC | PRN
  Administered 2018-08-22: 650 mg via ORAL
  Filled 2018-08-22: qty 2

## 2018-08-22 MED ORDER — MORPHINE SULFATE (PF) 0.5 MG/ML IJ SOLN
INTRAMUSCULAR | Status: AC
  Filled 2018-08-22: qty 10

## 2018-08-22 MED ORDER — DIPHENHYDRAMINE HCL 25 MG PO CAPS: 25.0000 mg | ORAL_CAPSULE | ORAL | Status: DC | PRN

## 2018-08-22 MED ORDER — OXYCODONE-ACETAMINOPHEN 5-325 MG PO TABS: 2.0000 | ORAL_TABLET | ORAL | Status: DC | PRN

## 2018-08-22 MED ORDER — LACTATED RINGERS IV SOLN
INTRAVENOUS | Status: DC | PRN
  Administered 2018-08-22: 17:00:00 via INTRAVENOUS

## 2018-08-22 MED ORDER — STERILE WATER FOR IRRIGATION IR SOLN
Status: DC | PRN
  Administered 2018-08-22: 1000 mL

## 2018-08-22 MED ORDER — FENTANYL CITRATE (PF) 100 MCG/2ML IJ SOLN
INTRAMUSCULAR | Status: DC | PRN
  Administered 2018-08-22 (×4): 25 ug via INTRAVENOUS

## 2018-08-22 MED ORDER — EPHEDRINE 5 MG/ML INJ: 10.0000 mg | INTRAVENOUS | Status: DC | PRN

## 2018-08-22 MED ORDER — PHENYLEPHRINE 40 MCG/ML (10ML) SYRINGE FOR IV PUSH (FOR BLOOD PRESSURE SUPPORT)
80.0000 ug | PREFILLED_SYRINGE | INTRAVENOUS | Status: DC | PRN
  Filled 2018-08-22: qty 10

## 2018-08-22 MED ORDER — TETANUS-DIPHTH-ACELL PERTUSSIS 5-2.5-18.5 LF-MCG/0.5 IM SUSP: 0.5000 mL | Freq: Once | INTRAMUSCULAR | Status: DC

## 2018-08-22 MED ORDER — ONDANSETRON HCL 4 MG/2ML IJ SOLN
INTRAMUSCULAR | Status: AC
  Filled 2018-08-22: qty 2

## 2018-08-22 MED ORDER — IBUPROFEN 800 MG PO TABS: 800.0000 mg | ORAL_TABLET | Freq: Four times a day (QID) | ORAL | Status: DC

## 2018-08-22 MED ORDER — ONDANSETRON HCL 4 MG/2ML IJ SOLN: 4.0000 mg | Freq: Four times a day (QID) | INTRAMUSCULAR | Status: DC | PRN

## 2018-08-22 MED ORDER — KETOROLAC TROMETHAMINE 30 MG/ML IJ SOLN
30.0000 mg | Freq: Four times a day (QID) | INTRAMUSCULAR | Status: DC | PRN
  Administered 2018-08-22: 30 mg via INTRAMUSCULAR

## 2018-08-22 MED ORDER — SODIUM CHLORIDE 0.9 % IV SOLN
INTRAVENOUS | Status: DC | PRN
  Administered 2018-08-22: 18:00:00 via INTRAVENOUS

## 2018-08-22 MED ORDER — SCOPOLAMINE 1 MG/3DAYS TD PT72
MEDICATED_PATCH | TRANSDERMAL | Status: DC | PRN
  Administered 2018-08-22: 1 via TRANSDERMAL

## 2018-08-22 MED ORDER — DEXAMETHASONE SODIUM PHOSPHATE 4 MG/ML IJ SOLN
INTRAMUSCULAR | Status: DC | PRN
  Administered 2018-08-22: 4 mg via INTRAVENOUS

## 2018-08-22 MED ORDER — OXYTOCIN 40 UNITS IN NORMAL SALINE INFUSION - SIMPLE MED: 2.5000 [IU]/h | INTRAVENOUS | Status: DC

## 2018-08-22 MED ORDER — KETOROLAC TROMETHAMINE 30 MG/ML IJ SOLN
INTRAMUSCULAR | Status: AC
  Filled 2018-08-22: qty 1

## 2018-08-22 MED ORDER — ZOLPIDEM TARTRATE 5 MG PO TABS: 5.0000 mg | ORAL_TABLET | Freq: Every evening | ORAL | Status: DC | PRN

## 2018-08-22 MED ORDER — LACTATED RINGERS IV SOLN
INTRAVENOUS | Status: DC
  Administered 2018-08-23: 02:00:00 via INTRAVENOUS

## 2018-08-22 MED ORDER — LIDOCAINE-EPINEPHRINE (PF) 2 %-1:200000 IJ SOLN
INTRAMUSCULAR | Status: DC | PRN
  Administered 2018-08-22 (×2): 3 mL via EPIDURAL
  Administered 2018-08-22 (×2): 5 mL via EPIDURAL

## 2018-08-22 MED ORDER — KETOROLAC TROMETHAMINE 30 MG/ML IJ SOLN
30.0000 mg | Freq: Four times a day (QID) | INTRAMUSCULAR | Status: DC
  Administered 2018-08-23 (×2): 30 mg via INTRAVENOUS
  Filled 2018-08-22 (×3): qty 1

## 2018-08-22 MED ORDER — METOCLOPRAMIDE HCL 5 MG/ML IJ SOLN
INTRAMUSCULAR | Status: DC | PRN
  Administered 2018-08-22: 10 mg via INTRAVENOUS

## 2018-08-22 MED ORDER — SCOPOLAMINE 1 MG/3DAYS TD PT72
MEDICATED_PATCH | TRANSDERMAL | Status: AC
  Filled 2018-08-22: qty 1

## 2018-08-22 MED ORDER — MORPHINE SULFATE (PF) 0.5 MG/ML IJ SOLN
INTRAMUSCULAR | Status: DC | PRN
  Administered 2018-08-22: 3 mg via EPIDURAL

## 2018-08-22 MED ORDER — SIMETHICONE 80 MG PO CHEW
80.0000 mg | CHEWABLE_TABLET | Freq: Three times a day (TID) | ORAL | Status: DC
  Administered 2018-08-23 – 2018-08-24 (×5): 80 mg via ORAL
  Filled 2018-08-22 (×5): qty 1

## 2018-08-22 MED ORDER — LIDOCAINE HCL (PF) 1 % IJ SOLN
30.0000 mL | INTRAMUSCULAR | Status: DC | PRN
Start: 2018-08-22 — End: 2018-08-22

## 2018-08-22 MED ORDER — OXYTOCIN 40 UNITS IN NORMAL SALINE INFUSION - SIMPLE MED: 2.5000 [IU]/h | INTRAVENOUS | Status: AC

## 2018-08-22 MED ORDER — PHENYLEPHRINE HCL (PRESSORS) 10 MG/ML IV SOLN
INTRAVENOUS | Status: DC | PRN
  Administered 2018-08-22: 40 ug via INTRAVENOUS

## 2018-08-22 MED ORDER — SODIUM CHLORIDE 0.9 % IR SOLN
Status: DC | PRN
  Administered 2018-08-22: 1000 mL

## 2018-08-22 MED ORDER — TERBUTALINE SULFATE 1 MG/ML IJ SOLN: 0.2500 mg | Freq: Once | INTRAMUSCULAR | Status: DC | PRN

## 2018-08-22 MED ORDER — FENTANYL CITRATE (PF) 100 MCG/2ML IJ SOLN: 25.0000 ug | INTRAMUSCULAR | Status: DC | PRN

## 2018-08-22 SURGICAL SUPPLY — 33 items
BENZOIN TINCTURE PRP APPL 2/3 (GAUZE/BANDAGES/DRESSINGS) ×2 IMPLANT
CHLORAPREP W/TINT 26ML (MISCELLANEOUS) ×2 IMPLANT
CLAMP CORD UMBIL (MISCELLANEOUS) ×2 IMPLANT
CLOTH BEACON ORANGE TIMEOUT ST (SAFETY) ×2 IMPLANT
DERMABOND ADVANCED (GAUZE/BANDAGES/DRESSINGS) ×1
DERMABOND ADVANCED .7 DNX12 (GAUZE/BANDAGES/DRESSINGS) ×1 IMPLANT
DRSG OPSITE POSTOP 4X10 (GAUZE/BANDAGES/DRESSINGS) ×2 IMPLANT
ELECT REM PT RETURN 9FT ADLT (ELECTROSURGICAL) ×2
ELECTRODE REM PT RTRN 9FT ADLT (ELECTROSURGICAL) ×1 IMPLANT
GLOVE BIO SURGEON STRL SZ 6.5 (GLOVE) ×2 IMPLANT
GLOVE BIOGEL PI IND STRL 6.5 (GLOVE) ×1 IMPLANT
GLOVE BIOGEL PI IND STRL 7.0 (GLOVE) ×2 IMPLANT
GLOVE BIOGEL PI INDICATOR 6.5 (GLOVE) ×1
GLOVE BIOGEL PI INDICATOR 7.0 (GLOVE) ×2
GOWN STRL REUS W/TWL LRG LVL3 (GOWN DISPOSABLE) ×4 IMPLANT
KIT ABG SYR 3ML LUER SLIP (SYRINGE) ×2 IMPLANT
NEEDLE HYPO 25X5/8 SAFETYGLIDE (NEEDLE) ×2 IMPLANT
NS IRRIG 1000ML POUR BTL (IV SOLUTION) ×2 IMPLANT
PACK C SECTION WH (CUSTOM PROCEDURE TRAY) ×2 IMPLANT
PAD ABD 8X7 1/2 STERILE (GAUZE/BANDAGES/DRESSINGS) ×2 IMPLANT
PAD OB MATERNITY 4.3X12.25 (PERSONAL CARE ITEMS) ×2 IMPLANT
PENCIL SMOKE EVAC W/HOLSTER (ELECTROSURGICAL) ×2 IMPLANT
SPONGE GAUZE 4X4 12PLY STER LF (GAUZE/BANDAGES/DRESSINGS) ×4 IMPLANT
STRIP CLOSURE SKIN 1/2X4 (GAUZE/BANDAGES/DRESSINGS) ×2 IMPLANT
SUT PLAIN 2 0 (SUTURE) ×1
SUT PLAIN ABS 2-0 CT1 27XMFL (SUTURE) ×1 IMPLANT
SUT VIC AB 0 CT1 36 (SUTURE) ×2 IMPLANT
SUT VIC AB 0 CTX 36 (SUTURE) ×3
SUT VIC AB 0 CTX36XBRD ANBCTRL (SUTURE) ×3 IMPLANT
SUT VIC AB 4-0 PS2 27 (SUTURE) ×2 IMPLANT
TAPE CLOTH SURG 4X10 WHT LF (GAUZE/BANDAGES/DRESSINGS) ×2 IMPLANT
TOWEL OR 17X24 6PK STRL BLUE (TOWEL DISPOSABLE) ×2 IMPLANT
WATER STERILE IRR 1000ML POUR (IV SOLUTION) ×2 IMPLANT

## 2018-08-22 NOTE — Anesthesia Procedure Notes (Signed)
Epidural Patient location during procedure: OB Start time: 08/22/2018 8:25 AM End time: 08/22/2018 8:40 AM  Staffing Anesthesiologist: Freddrick March, MD Performed: anesthesiologist   Preanesthetic Checklist Completed: patient identified, pre-op evaluation, timeout performed, IV checked, risks and benefits discussed and monitors and equipment checked  Epidural Patient position: sitting Prep: site prepped and draped and DuraPrep Patient monitoring: continuous pulse ox, blood pressure, heart rate and cardiac monitor Approach: midline Location: L3-L4 Injection technique: LOR air  Needle:  Needle type: Tuohy  Needle gauge: 17 G Needle length: 9 cm Needle insertion depth: 5 cm Catheter type: closed end flexible Catheter size: 19 Gauge Catheter at skin depth: 11 cm Test dose: negative  Assessment Sensory level: T8 Events: blood not aspirated, injection not painful, no injection resistance, negative IV test and no paresthesia  Additional Notes Patient identified. Risks/Benefits/Options discussed with patient including but not limited to bleeding, infection, nerve damage, paralysis, failed block, incomplete pain control, headache, blood pressure changes, nausea, vomiting, reactions to medication both or allergic, itching and postpartum back pain. Confirmed with bedside nurse the patient's most recent platelet count. Confirmed with patient that they are not currently taking any anticoagulation, have any bleeding history or any family history of bleeding disorders. Patient expressed understanding and wished to proceed. All questions were answered. Sterile technique was used throughout the entire procedure. Please see nursing notes for vital signs. Test dose was given through epidural catheter and negative prior to continuing to dose epidural or start infusion. Warning signs of high block given to the patient including shortness of breath, tingling/numbness in hands, complete motor block,  or any concerning symptoms with instructions to call for help. Patient was given instructions on fall risk and not to get out of bed. All questions and concerns addressed with instructions to call with any issues or inadequate analgesia.  Reason for block:procedure for pain

## 2018-08-22 NOTE — Transfer of Care (Signed)
Immediate Anesthesia Transfer of Care Note  Patient: Katherine Nunez  Procedure(s) Performed: CESAREAN SECTION (N/A Abdomen)  Patient Location: PACU  Anesthesia Type:Epidural  Level of Consciousness: awake, alert  and oriented  Airway & Oxygen Therapy: Patient Spontanous Breathing  Post-op Assessment: Report given to RN and Post -op Vital signs reviewed and stable  Post vital signs: Reviewed and stable  Last Vitals:  Vitals Value Taken Time  BP 102/61 08/22/18 1819  Temp 36.9 C 08/22/18 1822  Pulse 100 08/22/18 1828  Resp 20 08/22/18 1828  SpO2 100 % 08/22/18 1828  Vitals shown include unvalidated device data.  Last Pain:  Vitals:   08/22/18 1822  TempSrc: Oral  PainSc: 7       Patients Stated Pain Goal: 4 (93/[id number]/42 [id number])  Complications: No apparent anesthesia complications

## 2018-08-22 NOTE — Op Note (Signed)
PROCEDURE DATE: 08/22/18  PREOPERATIVE DIAGNOSIS: Intrauterine pregnancy at 39.2 wga, Indication: arrest of dilation, non-reassuring FHT  POSTOPERATIVE DIAGNOSIS:The same  PROCEDURE: Primary Low TransverseCesarean Section  SURGEON: Dr. Lucillie Garfinkel  INDICATIONS:This is a 23 yo G3P1011 at 21.2 wga requiring cesarean section secondary to arrest of dilation and nrFHT.  She progressed to 6.5cm but then made no further progression for >3-4 hours despite adequate contractions on IUPC. Caput forming. FHT with deep variables, slower to recover each time with dropping variability. Decision made to proceed with pLTCS.The risks of cesarean section discussed with the patient included but were not limited to: bleeding which may require transfusion or reoperation; infection which may require antibiotics; injury to bowel, bladder, ureters or other surrounding organs; injury to the fetus; need for additional procedures including hysterectomy in the event of a life-threatening hemorrhage; placental abnormalities wth subsequent pregnancies, incisional problems, thromboembolic phenomenon and other postoperative/anesthesia complications. The patient agreed with the proposed plan, giving informed consent for the procedure.   FINDINGS: Viable femaleinfant in vertex presentation,APGARs pending, Weight pending, Amniotic fluid clear, Intact placenta, three vessel cord. Grossly normal uterus, ovaries and fallopian tubes. .  ANESTHESIA: Epidural ESTIMATED BLOOD LOSS: pending SPECIMENS: Placenta for routine COMPLICATIONS: None immediate   PROCEDURE IN DETAIL: The patient received intravenous antibiotics (2g Ancef) and had sequential compression devices applied to her lower extremities while in the preoperative area. Shewasthen taken to the operating roomwhere epidural anesthesiawas dosed up to surgical level andwas found to be adequate. She was then placed in a dorsal supine position with a  leftward tilt,and prepped and draped in a sterile manner.A foley catheter was placed into her bladder and attached to constant gravity. After an adequate timeout was performed, aPfannenstiel skin incision was made with scalpel and carried through to the underlying layer of fascia. The fascia was incised in the midline and this incision was extended bilaterally using the Mayo scissors. Kocher clamps were applied to the superior aspect of the fascial incision and the underlying rectus muscles were dissected off bluntly. A similar process was carried out on the inferior aspect of the facial incision. The rectus muscles were separated in the midline bluntly and the peritoneum was entered bluntly. A bladder flap was created sharply and developed bluntly.Atransverse hysterotomy was made with a scalpel and extended bilaterally bluntly. The bladder blade was then removed. The infant was successfully delivered, and cord was clamped and cut and infant was handed over to awaiting neonatology team. Uterine massage was then administered and the placenta delivered intact with three-vessel cord. Cord gases were taken. The uterus was cleared of clot and debris. The hysterotomy was closed with 0 vicryl.A second imbricating suture of 0-vicryl was used to reinforce the incision and aid in hemostasis.The fascia was closed with 0-Vicryl in a running fashion with good restoration of anatomy. The subcutaneus tissue was irrigated and hemostatic. The skin was closed with 4-0 Vicryl in a subcuticular fashion.  Final EBL was pending at final count (all surgical site and was hemostatic at end of procedure) without any further bleeding on exam.    It's a girl - "Ariella" to join older sister!!!!   Pt tolerated the procedure well. All sponge/lap/needle counts were correct X 2. Pt taken to recovery room in stable condition.   Lucillie Garfinkel MD

## 2018-08-22 NOTE — Anesthesia Preprocedure Evaluation (Signed)
Anesthesia Evaluation  Patient identified by MRN, date of birth, ID band Patient awake    Reviewed: Allergy & Precautions, NPO status , Patient's Chart, lab work & pertinent test results  Airway Mallampati: II  TM Distance: >3 FB Neck ROM: Full    Dental no notable dental hx.    Pulmonary neg pulmonary ROS,    Pulmonary exam normal breath sounds clear to auscultation       Cardiovascular negative cardio ROS Normal cardiovascular exam Rhythm:Regular Rate:Normal     Neuro/Psych PSYCHIATRIC DISORDERS Anxiety Depression negative neurological ROS     GI/Hepatic negative GI ROS, Neg liver ROS,   Endo/Other  negative endocrine ROS  Renal/GU negative Renal ROS  negative genitourinary   Musculoskeletal negative musculoskeletal ROS (+)   Abdominal   Peds  Hematology negative hematology ROS (+)   Anesthesia Other Findings   Reproductive/Obstetrics (+) Pregnancy                             Anesthesia Physical Anesthesia Plan  ASA: II  Anesthesia Plan: Epidural   Post-op Pain Management:    Induction:   PONV Risk Score and Plan: Treatment may vary due to age or medical condition  Airway Management Planned: Natural Airway  Additional Equipment:   Intra-op Plan:   Post-operative Plan:   Informed Consent: I have reviewed the patients History and Physical, chart, labs and discussed the procedure including the risks, benefits and alternatives for the proposed anesthesia with the patient or authorized representative who has indicated his/her understanding and acceptance.       Plan Discussed with: Anesthesiologist  Anesthesia Plan Comments: (Patient identified. Risks, benefits, options discussed with patient including but not limited to bleeding, infection, nerve damage, paralysis, failed block, incomplete pain control, headache, blood pressure changes, nausea, vomiting, reactions to  medication, itching, and post partum back pain. Confirmed with bedside nurse the patient's most recent platelet count. Confirmed with the patient that they are not taking any anticoagulation, have any bleeding history or any family history of bleeding disorders. Patient expressed understanding and wishes to proceed. All questions were answered. )        Anesthesia Quick Evaluation  

## 2018-08-22 NOTE — Progress Notes (Signed)
[place] confirms vertex presentation after CLE. Vertex well applied. AROM for clear fluid, SVE5/50/-2. No HSV outbreaks. Start pitocin.    Arty Baumgartner MD

## 2018-08-22 NOTE — Anesthesia Postprocedure Evaluation (Signed)
Anesthesia Post Note  Patient: Katherine Nunez  Procedure(s) Performed: CESAREAN SECTION (N/A Abdomen)     Patient location during evaluation: PACU Anesthesia Type: Epidural Level of consciousness: oriented and awake and alert Pain management: pain level controlled Vital Signs Assessment: post-procedure vital signs reviewed and stable Respiratory status: spontaneous breathing, respiratory function stable and nonlabored ventilation Cardiovascular status: blood pressure returned to baseline and stable Postop Assessment: no headache, no backache, no apparent nausea or vomiting, epidural receding and patient able to bend at knees Anesthetic complications: no    Last Vitals:  Vitals:   08/22/18 1930 08/22/18 2000  BP: 106/67 106/74  Pulse: 88 80  Resp: 18 18  Temp:  37.6 C  SpO2: 98% 99%    Last Pain:  Vitals:   08/22/18 2000  TempSrc: Oral  PainSc: 0-No pain   Pain Goal: Patients Stated Pain Goal: 4 (08/22/18 1845)                 Kazden Largo A.

## 2018-08-22 NOTE — Progress Notes (Signed)
Exam unchanged despite at least 3-4 hours of adequate contractions on IUPC.  + caput. FHT now with recurrent deep variables and slower recovery to baseline. Recommend primary CS for arrest of dilation in the setting of nrFHT.  Risks discussed including infection, bleeding, damage to surrounding structures, the need for additional procedures including hysterectomy, and the possibility of uterine rupture with neonatal morbidity/mortality, scarring, and abnormal placentation with subsequent pregnancies. Patient agrees to proceed.  2g ancef on call to OR.    Arty Baumgartner MD

## 2018-08-22 NOTE — Lactation Note (Signed)
This note was copied from a baby's chart. Lactation Consultation Note  Patient Name: Katherine Nunez Today's Date: 08/22/2018 Reason for consult: Initial assessment;Term;1st time breastfeeding  P2 mother whose infant is now 19 hours old.  Mother did not breast feed her first child (now 25 years old)  Baby was STS on mother's chest and sleeping when I arrived.  Encouraged mother to feed 8-12 times/24 hours or sooner if baby shows feeding cues.  Reviewed cues.  Mother has been instructed how to hand express but is unable to obtain any colostrum drops at this time.  Colostrum container provided and milk storage times reviewed.  Finger feeding demonstrated.    Mother's breasts are soft and non tender and nipples are everted and intact.  Breast tissue is compressible.    Mother will return to work in 23 weeks and has a DEBP for home use.  Mom made aware of O/P services, breastfeeding support groups, community resources, and our phone # for post-discharge questions. She will call for latch assistance as needed.     Maternal Data Formula Feeding for Exclusion: No Has patient been taught Hand Expression?: Yes Does the patient have breastfeeding experience prior to this delivery?: No  Feeding    LATCH Score                   Interventions    Lactation Tools Discussed/Used     Consult Status Consult Status: Follow-up Date: 08/23/18 Follow-up type: In-patient    Lynnet Hefley R Neno Hohensee 08/22/2018, 9:12 PM

## 2018-08-22 NOTE — Brief Op Note (Addendum)
08/22/2018  7:44 PM  PATIENT:  Katherine Nunez  23 y.o. female  PRE-OPERATIVE DIAGNOSIS:  arrest of dilation, fetal heart rate indication  POST-OPERATIVE DIAGNOSIS:  arrest of dilation, fetal heart rate indication  PROCEDURE:  Procedure(s): CESAREAN SECTION (N/A)  SURGEON:  Surgeon(s) and Role:    * Charlyne Robertshaw, Colin Benton, MD - Primary  PHYSICIAN ASSISTANT:   ASSISTANTS: Heather RFNA   ANESTHESIA:   spinal  EBL:  pending  BLOOD ADMINISTERED:none  DRAINS: Urinary Catheter (Foley)   LOCAL MEDICATIONS USED:  Amount: none  SPECIMEN:  Source of Specimen:  placenta  DISPOSITION OF SPECIMEN:  routine  COUNTS:  YES  TOURNIQUET:  * No tourniquets in log *  DICTATION: .Note written in EPIC  PLAN OF CARE: Admit to inpatient   PATIENT DISPOSITION:  PACU - hemodynamically stable.   Delay start of Pharmacological VTE agent (>24hrs) due to surgical blood loss or risk of bleeding: not applicable

## 2018-08-23 LAB — CBC
HCT: 28.5 % — ABNORMAL LOW (ref 36.0–46.0)
Hemoglobin: 9.5 g/dL — ABNORMAL LOW (ref 12.0–15.0)
MCH: 30.4 pg (ref 26.0–34.0)
MCHC: 33.3 g/dL (ref 30.0–36.0)
MCV: 91.1 fL (ref 80.0–100.0)
Platelets: 150 10*3/uL (ref 150–400)
RBC: 3.13 MIL/uL — ABNORMAL LOW (ref 3.87–5.11)
RDW: 13.6 % (ref 11.5–15.5)
WBC: 19.5 10*3/uL — ABNORMAL HIGH (ref 4.0–10.5)
nRBC: 0 % (ref 0.0–0.2)

## 2018-08-23 MED ORDER — IBUPROFEN 800 MG PO TABS
800.0000 mg | ORAL_TABLET | Freq: Four times a day (QID) | ORAL | Status: DC
  Administered 2018-08-23 – 2018-08-24 (×5): 800 mg via ORAL
  Filled 2018-08-23 (×5): qty 1

## 2018-08-23 NOTE — Progress Notes (Signed)
CSW received consult for history of anxiety, depression, panic attacks and PPD.  CSW met with [name] to offer support and complete assessment.    [name] sitting up in bed with hand in basinet soothing infant, when CSW entered the room. CSW introduced self and explained reason for consult to which [name] expressed understanding. [name] pleasant and easy to engage. CSW inquired about [name]'s mental health history and [name] acknowledged a history of anxiety and depression since she was in high school. [name] shared that she has been anxious with "everything going on right now" but reported it's nothing that isn't manageable. [name] denied currently being on medications or receiving counseling. CSW inquired about if [name] experienced any PPD after her 25-year-old was born and [name] stated she did. [name] described symptoms such as having a lack of motivation and not really wanting to do anything. [name] stated symptoms lasted for about a year before going away. [name] reported she is currently feeling good and not experiencing any mental health symptoms. CSW provided education regarding the baby blues period vs. perinatal mood disorders, discussed treatment and gave resources for mental health follow up if concerns arise. CSW recommended self-evaluation during the postpartum time period using the New Mom Checklist from Postpartum Progress and encouraged [name] to contact a medical professional if symptoms are noted at any time. [name] denied any current SI, HI or DV and reported having a good support system consisting of FOB, her parents, and FOB's family.  [name] confirmed having all essential items for infant once discharged and stated infant would be sleeping in a basinet once home. CSW provided review of Sudden Infant Death Syndrome (SIDS) precautions and safe sleeping habits.    [name] denied any further questions, concerns or need for resources. CSW identifies no further need for intervention and no barriers to discharge at this time.  Katherine Nunez, LCSWA  [facility] [phone] 

## 2018-08-23 NOTE — Lactation Note (Signed)
This note was copied from a baby's chart. Lactation Consultation Note  Patient Name: Katherine Nunez [id number] Date: 08/23/2018 Reason for consult: Follow-up assessment;1st time breastfeeding;Primapara;Term;Early term 37-38.6wks;Infant weight loss;Other (Comment)(5 % weight loss/ confirmed doc flow sheet data / mom sound asleep)  Baby is 40 1/2 hours old  LC spoke with dad and he mentioned the baby fed well the last feeding. LC reviewed doc flow sheets with him And confirmed feedings, voids and stools.      Maternal Data    Feeding Feeding Type: (last fed at 1510 for 8 mins and dad mentioned baby fed well)  LATCH Score                      Interventions Interventions: Breast feeding basics reviewed  Lactation Tools Discussed/Used     Consult Status Consult Status: Follow-up Date: 08/24/18 Follow-up type: In-patient    [place] 08/23/2018, 5:50 PM

## 2018-08-23 NOTE — Progress Notes (Signed)
Patient doing well BP 108/63 (BP Location: Right Arm)   Pulse 88   Temp 99.3 F (37.4 C) (Oral)   Resp 20   Ht 5\' 2"  (1.575 m)   Wt 74.7 kg   LMP 11/20/2017   SpO2 99%   BMI 30.11 kg/m  Results for orders placed or performed during the hospital encounter of 08/22/18 (from the past 24 hour(s))  CBC     Status: Abnormal   Collection Time: 08/23/18  5:40 AM  Result Value Ref Range   WBC 19.5 (H) 4.0 - 10.5 K/uL   RBC 3.13 (L) 3.87 - 5.11 MIL/uL   Hemoglobin 9.5 (L) 12.0 - 15.0 g/dL   HCT 28.5 (L) 36.0 - 46.0 %   MCV 91.1 80.0 - 100.0 fL   MCH 30.4 26.0 - 34.0 pg   MCHC 33.3 30.0 - 36.0 g/dL   RDW 13.6 11.5 - 15.5 %   Platelets 150 150 - 400 K/uL   nRBC 0.0 0.0 - 0.2 %   Abdomen is soft and non tender  POD # 1  Doing well Routine care

## 2018-08-24 ENCOUNTER — Encounter (HOSPITAL_COMMUNITY): Payer: Self-pay | Admitting: Obstetrics and Gynecology

## 2018-08-24 ENCOUNTER — Other Ambulatory Visit: Payer: Self-pay

## 2018-08-24 ENCOUNTER — Inpatient Hospital Stay (HOSPITAL_COMMUNITY)
Admission: AD | Admit: 2018-08-24 | Discharge: 2018-08-25 | Disposition: A | Discharge status: Home / Self Care | Payer: 59 | Attending: Obstetrics and Gynecology | Admitting: Obstetrics and Gynecology

## 2018-08-24 DIAGNOSIS — R0602 Shortness of breath: Secondary | ICD-10-CM | POA: Insufficient documentation

## 2018-08-24 DIAGNOSIS — Z98891 History of uterine scar from previous surgery: Secondary | ICD-10-CM

## 2018-08-24 DIAGNOSIS — O9089 Other complications of the puerperium, not elsewhere classified: Secondary | ICD-10-CM | POA: Insufficient documentation

## 2018-08-24 DIAGNOSIS — R0789 Other chest pain: Secondary | ICD-10-CM

## 2018-08-24 DIAGNOSIS — Z9889 Other specified postprocedural states: Secondary | ICD-10-CM

## 2018-08-24 MED ORDER — OXYCODONE HCL 5 MG PO TABS: 5.0000 mg | ORAL_TABLET | ORAL | 0 refills | Status: DC | PRN

## 2018-08-24 MED ORDER — IBUPROFEN 800 MG PO TABS: 800.0000 mg | ORAL_TABLET | Freq: Four times a day (QID) | ORAL | 0 refills | Status: DC

## 2018-08-24 NOTE — MAU Note (Addendum)
Presents with chest pain she has noticed since being in the hospital since yesterday. SOB starting today; moreso with activity. Pt with c-section on 08/22/18; discharged today.    Gilmer Mor RN

## 2018-08-24 NOTE — Discharge Summary (Signed)
Obstetric Discharge Summary Reason for Admission: induction of labor Prenatal Procedures: none Intrapartum Procedures: cesarean: low cervical, transverse Postpartum Procedures: none Complications-Operative and Postpartum: none Hemoglobin  Date Value Ref Range Status  08/23/2018 9.5 (L) 12.0 - 15.0 g/dL Final   HCT  Date Value Ref Range Status  08/23/2018 28.5 (L) 36.0 - 46.0 % Final    Physical Exam:  General: alert, cooperative and appears stated age 23: appropriate Uterine Fundus: firm Incision: healing well, no significant drainage, no dehiscence DVT Evaluation: No evidence of DVT seen on physical exam.  Discharge Diagnoses: Term Pregnancy-delivered  Discharge Information: Date: 08/24/2018 Activity: pelvic rest Diet: routine Medications: Ibuprofen and oxycodone Condition: stable Instructions: refer to practice specific booklet Discharge to: home   Newborn Data: Live born female  Birth Weight: 8 lb 4.8 oz (3765 g) APGAR: 9, 9  Newborn Delivery   Birth date/time: 08/22/2018 17:34:00 Delivery type: C-Section, Low Transverse Trial of labor: Yes C-section categorization: Primary      Home with mother.  Cyril Mourning 08/24/2018, 6:04 AM

## 2018-08-24 NOTE — Lactation Note (Signed)
This note was copied from a baby's chart. Lactation Consultation Note  Patient Name: Katherine Nunez [id number] Date: 08/24/2018 Reason for consult: Follow-up assessment;Term;Primapara Baby is 40 hours old/6% weight loss.  Mom states she has had difficulty latching baby the last 2 feedings.  Assisted with positioning baby skin to skin in football hold.  Baby opened wide and latched easily with first attempt.  Observed active suck/swallows.  Discussed milk coming to volume and the prevention and treatment of engorgement.  Mom does have a breast pump at home.  Instructed to pump if unable to achieve a good latch.  Questions answered.  Reviewed lactation outpatient services and encouraged to call prn.  Maternal Data    Feeding Feeding Type: Breast Fed  LATCH Score Latch: Grasps breast easily, tongue down, lips flanged, rhythmical sucking.  Audible Swallowing: A few with stimulation  Type of Nipple: Everted at rest and after stimulation  Comfort (Breast/Nipple): Soft / non-tender  Hold (Positioning): Assistance needed to correctly position infant at breast and maintain latch.  LATCH Score: 8  Interventions Interventions: Assisted with latch;Adjust position;Skin to skin;Support pillows  Lactation Tools Discussed/Used     Consult Status Consult Status: Complete Follow-up type: Call as needed    Katherine Nunez 08/24/2018, 10:25 AM

## 2018-08-25 ENCOUNTER — Inpatient Hospital Stay (HOSPITAL_COMMUNITY): Payer: 59

## 2018-08-25 DIAGNOSIS — O9089 Other complications of the puerperium, not elsewhere classified: Secondary | ICD-10-CM | POA: Diagnosis not present

## 2018-08-25 DIAGNOSIS — Z9889 Other specified postprocedural states: Secondary | ICD-10-CM

## 2018-08-25 DIAGNOSIS — R0602 Shortness of breath: Secondary | ICD-10-CM

## 2018-08-25 DIAGNOSIS — R0789 Other chest pain: Secondary | ICD-10-CM | POA: Diagnosis not present

## 2018-08-25 NOTE — Discharge Instructions (Signed)
Your EKG and Chest X-ray were normal. Your vital signs and physical exam were very reassuring. I do not believe anything serious is causing your symptoms right now. If your shortness of breath worsens, you develop a cough, you experience swelling or pain in your legs please return.

## 2018-08-25 NOTE — MAU Provider Note (Signed)
History     CSN: [id number]  Arrival date and time: 08/24/18 2255   None     Chief Complaint  Patient presents with  . Shortness of Breath  . Chest Pain   HPI   Katherine Nunez is 23 y.o. [id number] female POD #3 from pLTCS for arrest of dilation. Patient reports that since being discharged this AM she has felt chest tightness and SOB. Occurs with rest and when going from sitting to standing. Has a history of anxiety, does not take any medications. States these symptoms feel similar to those of panic attacks but more severe. Denies history of asthma or other lung problems. No congestion, cough, hemoptysis. Denies cardiac history. No swelling or pain in her lower extremities. No prior history of VTE.   OB History    Gravida  2   Para  2   Term  2   Preterm      AB      Living  2     SAB      TAB      Ectopic      Multiple  0   Live Births  2           Past Medical History:  Diagnosis Date  . Anxiety   . Depression   . History of abnormal cervical Pap smear   . History of anxiety   . History of panic attacks   . 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 Vaginal Pap smear, abnormal     Past Surgical History:  Procedure Laterality Date  . CESAREAN SECTION N/A 08/22/2018   Procedure: CESAREAN SECTION;  Surgeon: Tyson Dense, MD;  Location: [facility] LD ORS;  Service: Obstetrics;  Laterality: N/A;  . NO PAST SURGERIES      Family History  Problem Relation Age of Onset  . Diabetes Father   . Hypertension Maternal Grandmother   . Hypertension Maternal Grandfather     Social History   Tobacco Use  . Smoking status: Never Smoker  . Smokeless tobacco: Never Used  Substance Use Topics  . Alcohol use: Not Currently  . Drug use: No    Allergies: No Known Allergies  Medications Prior to Admission  Medication Sig Dispense Refill Last Dose  . ibuprofen (ADVIL) 800 MG tablet Take 1 tablet (800 mg total) by mouth every 6 (six)  hours. 30 tablet 0   . oxyCODONE (OXY IR/ROXICODONE) 5 MG immediate release tablet Take 1-2 tablets (5-10 mg total) by mouth every 4 (four) hours as needed for moderate pain. 30 tablet 0   . Prenatal Vit-Fe Fumarate-FA (PRENATAL MULTIVITAMIN) TABS tablet Take 1 tablet by mouth daily at 12 noon.       Review of Systems  Constitutional: Negative for chills and fever.  HENT: Negative for congestion, rhinorrhea and sore throat.   Respiratory: Positive for chest tightness and shortness of breath. Negative for cough and wheezing.   Cardiovascular: Positive for chest pain. Negative for leg swelling.  Gastrointestinal: Negative for nausea and vomiting.  Musculoskeletal: Positive for back pain.  Skin: Negative for rash.  Neurological: Negative for dizziness and light-headedness.   Physical Exam   Blood pressure 116/69, pulse 93, temperature 98.8 F (37.1 C), temperature source Oral, resp. rate 14, last menstrual period 11/20/2017, SpO2 100 %, currently breastfeeding.  Physical Exam  Constitutional: She is oriented to person, place, and time. She appears well-developed and well-nourished. No distress.  HENT:  Head: Normocephalic and atraumatic.  Eyes: Conjunctivae and EOM are normal.  Neck: Normal range of motion. Neck supple.  Cardiovascular: Normal rate, regular rhythm and normal heart sounds.  No murmur heard. Negative Homan's sign bilaterally. No palpable cords.   Respiratory: Effort normal and breath sounds normal. No respiratory distress. She has no wheezes. She has no rales.  GI: Soft. She exhibits no distension. There is no abdominal tenderness. There is no rebound and no guarding.  Incision clean/dry/intact.   Musculoskeletal: Normal range of motion.        General: No edema.  Neurological: She is alert and oriented to person, place, and time.  Skin: Skin is warm and dry. She is not diaphoretic.  No erythema of lower extremities.     MAU Course  Procedures  MDM EKG and CXR  ordered. Vital signs reviewed.   Assessment and Plan   1. Post-operative state   2. Shortness of breath   3. Postpartum complication   4. Status post primary low transverse cesarean section   5. Chest tightness    EKG without ST segment elevation or acute T wave changes. CXR and lung exam clear. Patient is not hypoxic or tachycardia. No evidence of DVT on exam. Therefore, while patient is at elevated risk for PE in post-operative state, low suspicion for that at present given the above. No evidence of cardiomegaly or pulmonary edema on exam or CXR. Symptoms may be related to patient's history of anxiety and acute change to social situation given birth of new infant and recent surgery. Strict return precautions discussed.   De HollingsheadCatherine L Charnette Nunez 08/25/2018, 12:22 AM

## 2018-08-27 ENCOUNTER — Inpatient Hospital Stay (HOSPITAL_COMMUNITY)
Admission: AD | Admit: 2018-08-27 | Payer: BC Managed Care – PPO | Source: Home / Self Care | Admitting: Obstetrics & Gynecology

## 2018-08-29 ENCOUNTER — Encounter (HOSPITAL_COMMUNITY): Payer: Self-pay

## 2018-08-29 ENCOUNTER — Other Ambulatory Visit: Payer: Self-pay

## 2018-08-29 ENCOUNTER — Inpatient Hospital Stay (HOSPITAL_COMMUNITY)
Admission: AD | Admit: 2018-08-29 | Discharge: 2018-08-29 | Disposition: A | Discharge status: Home / Self Care | Payer: 59 | Attending: Obstetrics and Gynecology | Admitting: Obstetrics and Gynecology

## 2018-08-29 DIAGNOSIS — M25471 Effusion, right ankle: Secondary | ICD-10-CM | POA: Diagnosis not present

## 2018-08-29 DIAGNOSIS — O9989 Other specified diseases and conditions complicating pregnancy, childbirth and the puerperium: Secondary | ICD-10-CM | POA: Insufficient documentation

## 2018-08-29 DIAGNOSIS — M62838 Other muscle spasm: Secondary | ICD-10-CM | POA: Diagnosis not present

## 2018-08-29 DIAGNOSIS — O34219 Maternal care for unspecified type scar from previous cesarean delivery: Secondary | ICD-10-CM | POA: Insufficient documentation

## 2018-08-29 DIAGNOSIS — K5900 Constipation, unspecified: Secondary | ICD-10-CM | POA: Diagnosis not present

## 2018-08-29 DIAGNOSIS — Z833 Family history of diabetes mellitus: Secondary | ICD-10-CM | POA: Diagnosis not present

## 2018-08-29 LAB — URINALYSIS, ROUTINE W REFLEX MICROSCOPIC
Bilirubin Urine: NEGATIVE
Glucose, UA: NEGATIVE mg/dL
Hgb urine dipstick: NEGATIVE
Ketones, ur: NEGATIVE mg/dL
Leukocytes,Ua: NEGATIVE
Nitrite: NEGATIVE
Protein, ur: NEGATIVE mg/dL
Specific Gravity, Urine: 1.012 (ref 1.005–1.030)
pH: 7 (ref 5.0–8.0)

## 2018-08-29 MED ORDER — CYCLOBENZAPRINE HCL 5 MG PO TABS: 5.0000 mg | ORAL_TABLET | Freq: Three times a day (TID) | ORAL | 0 refills | Status: DC | PRN

## 2018-08-29 MED ORDER — FLEET ENEMA 7-19 GM/118ML RE ENEM
1.0000 | ENEMA | Freq: Once | RECTAL | Status: AC
  Administered 2018-08-29: 1 via RECTAL

## 2018-08-29 NOTE — Discharge Instructions (Signed)
Constipation, Adult Constipation is when a person has fewer bowel movements in a week than normal, has difficulty having a bowel movement, or has stools that are dry, hard, or larger than normal. Constipation may be caused by an underlying condition. It may become worse with age if a person takes certain medicines and does not take in enough fluids. Follow these instructions at home: Eating and drinking   Eat foods that have a lot of fiber, such as fresh fruits and vegetables, whole grains, and beans.  Limit foods that are high in fat, low in fiber, or overly processed, such as french fries, hamburgers, cookies, candies, and soda.  Drink enough fluid to keep your urine clear or pale yellow. General instructions  Exercise regularly or as told by your health care provider.  Go to the restroom when you have the urge to go. Do not hold it in.  Take over-the-counter and prescription medicines only as told by your health care provider. These include any fiber supplements.  Practice pelvic floor retraining exercises, such as deep breathing while relaxing the lower abdomen and pelvic floor relaxation during bowel movements.  Watch your condition for any changes.  Keep all follow-up visits as told by your health care provider. This is important. Contact a health care provider if:  You have pain that gets worse.  You have a fever.  You do not have a bowel movement after 4 days.  You vomit.  You are not hungry.  You lose weight.  You are bleeding from the anus.  You have thin, pencil-like stools. Get help right away if:  You have a fever and your symptoms suddenly get worse.  You leak stool or have blood in your stool.  Your abdomen is bloated.  You have severe pain in your abdomen.  You feel dizzy or you faint. This information is not intended to replace advice given to you by your health care provider. Make sure you discuss any questions you have with your health care  provider. Document Released: 11/26/2003 Document Revised: 09/17/2015 Document Reviewed: 08/18/2015 Elsevier Interactive Patient Education  2019 [employer]  Edema  Edema is an abnormal buildup of fluids in the body tissues and under the skin. Swelling of the legs, feet, and ankles is a common symptom that becomes more likely as you get older. Swelling is also common in looser tissues, like around the eyes. When the affected area is squeezed, the fluid may move out of that spot and leave a dent for a few moments. This dent is called pitting edema. There are many possible causes of edema. Eating too much salt (sodium) and being on your feet or sitting for a long time can cause edema in your legs, feet, and ankles. Hot weather may make edema worse. Common causes of edema include:  Heart failure.  Liver or kidney disease.  Weak leg blood vessels.  Cancer.  An injury.  Pregnancy.  Medicines.  Being obese.  Low protein levels in the blood. Edema is usually painless. Your skin may look swollen or shiny. Follow these instructions at home:  Keep the affected body part raised (elevated) above the level of your heart when you are sitting or lying down.  Do not sit still or stand for long periods of time.  Do not wear tight clothing. Do not wear garters on your upper legs.  Exercise your legs to get your circulation going. This helps to move the fluid back into your blood vessels, and it may  help the swelling go down.  Wear elastic bandages or support stockings to reduce swelling as told by your health care provider.  Eat a low-salt (low-sodium) diet to reduce fluid as told by your health care provider.  Depending on the cause of your swelling, you may need to limit how much fluid you drink (fluid restriction).  Take over-the-counter and prescription medicines only as told by your health care provider. Contact a health care provider if:  Your edema does not get better with  treatment.  You have heart, liver, or kidney disease and have symptoms of edema.  You have sudden and unexplained weight gain. Get help right away if:  You develop shortness of breath or chest pain.  You cannot breathe when you lie down.  You develop pain, redness, or warmth in the swollen areas.  You have heart, liver, or kidney disease and suddenly get edema.  You have a fever and your symptoms suddenly get worse. Summary  Edema is an abnormal buildup of fluids in the body tissues and under the skin.  Eating too much salt (sodium) and being on your feet or sitting for a long time can cause edema in your legs, feet, and ankles.  Keep the affected body part raised (elevated) above the level of your heart when you are sitting or lying down. This information is not intended to replace advice given to you by your health care provider. Make sure you discuss any questions you have with your health care provider. Document Released: 02/27/2005 Document Revised: 04/01/2016 Document Reviewed: 04/01/2016 Elsevier Interactive Patient Education  2019 [employer].

## 2018-08-29 NOTE — MAU Provider Note (Signed)
Chief Complaint:  Constipation and Leg Swelling   First Provider Initiated Contact with Patient 08/29/18 0205       HPI: Katherine Nunez is a 23 y.o. [id number] who presents to maternity admissions reporting constipation and slight swelling in right ankle.  States has not had a bowel movement in a week.  Tried Miralax once.  Also has a little swelling in right ankle.  No pain or redness.  No injury. Marland Kitchen Has had muscle spasm in mid back since surgery. Heat does not help She denies vaginal itching/burning, urinary symptoms, h/a, dizziness, n/v, or fever/chills.    RN Note: Pt states that she had a c-section on the 11th and was released on the 13th.  Pt reports not having a bowel movement in one week.  Pt reports that her right ankle is swollen  Past Medical History: Past Medical History:  Diagnosis Date  . Anxiety   . Depression   . History of abnormal cervical Pap smear   . History of anxiety   . History of panic attacks   . 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 Vaginal Pap smear, abnormal     Past obstetric history: OB History  Gravida Para Term Preterm AB Living  2 2 2     2   SAB TAB Ectopic Multiple Live Births        0 2    # Outcome Date GA Lbr Len/2nd Weight Sex Delivery Anes PTL Lv  2 Term 08/22/18 [redacted]w[redacted]d   F CS-LTranv   LIV  1 Term 05/08/14 [redacted]w[redacted]d / 00:42 3160 g F Vag-Spont EPI  LIV     Birth Comments: Normal NBS HB FA ---NL#[id number]    Past Surgical History: Past Surgical History:  Procedure Laterality Date  . CESAREAN SECTION N/A 08/22/2018   Procedure: CESAREAN SECTION;  Surgeon: Tyson Dense, MD;  Location: [facility] LD ORS;  Service: Obstetrics;  Laterality: N/A;  . NO PAST SURGERIES      Family History: Family History  Problem Relation Age of Onset  . Diabetes Father   . Hypertension Maternal Grandmother   . Hypertension Maternal Grandfather     Social History: Social History   Tobacco Use  . Smoking status: Never Smoker   . Smokeless tobacco: Never Used  Substance Use Topics  . Alcohol use: Not Currently  . Drug use: No    Allergies: No Known Allergies  Meds:  Medications Prior to Admission  Medication Sig Dispense Refill Last Dose  . ibuprofen (ADVIL) 800 MG tablet Take 1 tablet (800 mg total) by mouth every 6 (six) hours. 30 tablet 0 08/29/2018 at Unknown time  . oxyCODONE (OXY IR/ROXICODONE) 5 MG immediate release tablet Take 1-2 tablets (5-10 mg total) by mouth every 4 (four) hours as needed for moderate pain. 30 tablet 0 08/29/2018 at Unknown time  . Prenatal Vit-Fe Fumarate-FA (PRENATAL MULTIVITAMIN) TABS tablet Take 1 tablet by mouth daily at 12 noon.   Past Month at Unknown time    I have reviewed patient's Past Medical Hx, Surgical Hx, Family Hx, Social Hx, medications and allergies.  ROS:  Review of Systems  Constitutional: Negative for chills and fever.  Respiratory: Negative for cough and shortness of breath.   Cardiovascular: Positive for leg swelling (slight swelling right ankle none elswhere).  Gastrointestinal: Positive for constipation. Negative for abdominal pain, diarrhea, nausea and vomiting.  Genitourinary: Positive for pelvic pain.  Musculoskeletal: Negative for back pain.  Neurological: Negative for dizziness and weakness.   Other systems negative     Physical Exam   Patient Vitals for the past 24 hrs:  BP Temp Pulse Resp SpO2 Weight  08/29/18 0159 109/73 98.7 F (37.1 C) 83 16 100 % -  08/29/18 0140 - - - - - 68.6 kg   Constitutional: Well-developed, well-nourished female in no acute distress.  Cardiovascular: normal rate and rhythm Respiratory: normal effort, no distress. GI: Abd soft, non-tender.  Lower abdomen is distended.  No rebound, No guarding. Honeycomb dsg intact with old drainage.   Bowel Sounds audible  MS: Extremities nontender, no edema, normal ROM   Calves roughly equal with right calf only 1/2 cm larger than left.  Negative Homans bilaterally.  Right  ankle has a trace amount of edema on lateral side, not appreciably different from left.   Mid back is tender around paraspinous muscles.  Neurologic: Alert and oriented x 4.   Grossly nonfocal. GU: Neg CVAT. Skin:  Warm and Dry Psych:  Affect appropriate.  PELVIC EXAM: deferred   Labs: Results for orders placed or performed during the hospital encounter of 08/29/18 (from the past 24 hour(s))  Urinalysis, Routine w reflex microscopic     Status: None   Collection Time: 08/29/18  2:47 AM  Result Value Ref Range   Color, Urine YELLOW YELLOW   APPearance CLEAR CLEAR   Specific Gravity, Urine 1.012 1.005 - 1.030   pH 7.0 5.0 - 8.0   Glucose, UA NEGATIVE NEGATIVE mg/dL   Hgb urine dipstick NEGATIVE NEGATIVE   Bilirubin Urine NEGATIVE NEGATIVE   Ketones, ur NEGATIVE NEGATIVE mg/dL   Protein, ur NEGATIVE NEGATIVE mg/dL   Nitrite NEGATIVE NEGATIVE   Leukocytes,Ua NEGATIVE NEGATIVE    --/--/O POS, O POS (06/11 0700)  Imaging:    MAU Course/MDM: I have ordered labs as follows: UA, normal Imaging ordered: none Results reviewed.  Vitals are normal   Negative Homans.     Treatments in MAU included Fleet enema offered and requested.  She had good results from it and felt much better.  I told her I did not think she needed Lovenox/dopplers at this time.  Recommend observe for increased edema or pain in calf.  Call office with changes. .   Pt stable at time of discharge.  Assessment: Postpartum Day # 7 Trace right ankle swelling Constipation, relieved (likely due to postoperative status and narcotics)  Plan: Discharge home Recommend observe leg for swelling and pain,  Continue Miralax until bowels emptied   Rx Flexeril for back spasm.  Cautioned re: side effects  Followup in office as scheduled next week   Notify them of changes or worsening.  Encouraged to return here or to other Urgent Care/ED if she develops worsening of symptoms, increase in pain, fever, or other concerning  symptoms.   Wynelle BourgeoisMarie Ashla Murph CNM, MSN Certified Nurse-Midwife 08/29/2018 2:05 AM

## 2018-08-29 NOTE — MAU Note (Signed)
Pt states that she had a c-section on the 11th and was released on the 13th.   Pt reports not having a bowel movement in one week.   Pt reports that her right ankle is swollen

## 2021-03-21 ENCOUNTER — Emergency Department (HOSPITAL_COMMUNITY): Payer: 59

## 2021-03-21 ENCOUNTER — Encounter (HOSPITAL_COMMUNITY): Payer: Self-pay | Admitting: Emergency Medicine

## 2021-03-21 ENCOUNTER — Emergency Department (HOSPITAL_COMMUNITY)
Admission: EM | Admit: 2021-03-21 | Discharge: 2021-03-21 | Disposition: A | Discharge status: Home / Self Care | Payer: 59 | Attending: Emergency Medicine | Admitting: Emergency Medicine

## 2021-03-21 DIAGNOSIS — N9489 Other specified conditions associated with female genital organs and menstrual cycle: Secondary | ICD-10-CM | POA: Insufficient documentation

## 2021-03-21 DIAGNOSIS — S30851A Superficial foreign body of abdominal wall, initial encounter: Secondary | ICD-10-CM | POA: Diagnosis present

## 2021-03-21 DIAGNOSIS — X58XXXA Exposure to other specified factors, initial encounter: Secondary | ICD-10-CM | POA: Diagnosis not present

## 2021-03-21 LAB — CBC WITH DIFFERENTIAL/PLATELET
Abs Immature Granulocytes: 0.01 10*3/uL (ref 0.00–0.07)
Basophils Absolute: 0 10*3/uL (ref 0.0–0.1)
Basophils Relative: 0 %
Eosinophils Absolute: 0.1 10*3/uL (ref 0.0–0.5)
Eosinophils Relative: 1 %
HCT: 36.3 % (ref 36.0–46.0)
Hemoglobin: 11.7 g/dL — ABNORMAL LOW (ref 12.0–15.0)
Immature Granulocytes: 0 %
Lymphocytes Relative: 35 %
Lymphs Abs: 1.7 10*3/uL (ref 0.7–4.0)
MCH: 29.3 pg (ref 26.0–34.0)
MCHC: 32.2 g/dL (ref 30.0–36.0)
MCV: 90.8 fL (ref 80.0–100.0)
Monocytes Absolute: 0.4 10*3/uL (ref 0.1–1.0)
Monocytes Relative: 9 %
Neutro Abs: 2.6 10*3/uL (ref 1.7–7.7)
Neutrophils Relative %: 55 %
Platelets: 346 10*3/uL (ref 150–400)
RBC: 4 MIL/uL (ref 3.87–5.11)
RDW: 12.3 % (ref 11.5–15.5)
WBC: 4.8 10*3/uL (ref 4.0–10.5)
nRBC: 0 % (ref 0.0–0.2)

## 2021-03-21 LAB — COMPREHENSIVE METABOLIC PANEL
ALT: 46 U/L — ABNORMAL HIGH (ref 0–44)
AST: 31 U/L (ref 15–41)
Albumin: 3.7 g/dL (ref 3.5–5.0)
Alkaline Phosphatase: 84 U/L (ref 38–126)
Anion gap: 8 (ref 5–15)
BUN: 17 mg/dL (ref 6–20)
CO2: 22 mmol/L (ref 22–32)
Calcium: 9.2 mg/dL (ref 8.9–10.3)
Chloride: 107 mmol/L (ref 98–111)
Creatinine, Ser: 0.62 mg/dL (ref 0.44–1.00)
GFR, Estimated: >60 mL/min (ref >60)
Glucose, Bld: 91 mg/dL (ref 70–99)
Potassium: 3.6 mmol/L (ref 3.5–5.1)
Sodium: 137 mmol/L (ref 135–145)
Total Bilirubin: 0.3 mg/dL (ref 0.3–1.2)
Total Protein: 7 g/dL (ref 6.5–8.1)

## 2021-03-21 LAB — I-STAT BETA HCG BLOOD, ED (MC, WL, AP ONLY): I-stat hCG, quantitative: <5 m[IU]/mL (ref <5)

## 2021-03-21 NOTE — ED Triage Notes (Signed)
Patient sent to [facility] from an urgent care for evaluation of drainage tube that broke inside of patient. Patient states she was having bilateral post-op drains removed on Friday when the drain on the left broke leaving part of the tubing under her skin. Right drain was removed without issue. Patient states she was given a referral from urgent care but that person was unable to see her so she came to [facility] today. Patient alert, oriented, ambulatory, and in no apparent distress at this time.

## 2021-03-21 NOTE — ED Provider Triage Note (Signed)
Emergency Medicine Provider Triage Evaluation Note  Katherine Nunez , a 26 y.o. female  was evaluated in triage.  Pt complains of postop complication.  Patient reports that she had a similar procedure performed in [place] on 12/20.  Patient had to drainage tubes to lower quadrant of abdomen.  States that massage therapist removed drainage tube from right lower quadrant.  There was difficulty removing the drainage tube to left lower quadrant so patient went to urgent care today.  Patient states that while attempting to have the left lower quadrant drain she removed it broke.  Patient reports that she is having generalized abdominal discomfort however this has been present since her surgery.  Review of Systems  Positive: Postop complication, generalized abdominal discomfort Negative: Fever, chills  Physical Exam  BP 120/78 (BP Location: Right Arm)    Pulse 92    Temp 99 F (37.2 C) (Oral)    Resp 15    SpO2 100%  Gen:   Awake, no distress   Resp:  Normal effort  MSK:   Moves extremities without difficulty  Other:  Abdomen soft, nondistended, minimal generalized tenderness throughout abdomen.  Surgical wounds to bilateral lower quadrants with no surrounding erythema, warmth, or purulent drainage.  Medical Decision Making  Medically screening exam initiated at 12:02 PM.  Appropriate orders placed.  Katherine Nunez was informed that the remainder of the evaluation will be completed by another provider, this initial triage assessment does not replace that evaluation, and the importance of remaining in the ED until their evaluation is complete.     Haskel Schroeder, [place] 03/21/21 1208

## 2021-03-21 NOTE — ED Provider Notes (Signed)
[facility]   CSN: [id number] Arrival date & time: 03/21/21  1020     History  Chief Complaint  Patient presents with   Foreign Body    Katherine KraftJaquana M Nunez is a 26 y.o. female.  HPI Patient is a 26 year old female who presents to the emergency department for evaluation of a foreign body in her abdominal wall.  Patient states that she had a tummy tuck in [place] on December 20.  She had 2 JP drains in place that her surgeon instructed to have removed by her PCP.  She went to urgent care earlier today to have these removed and one came out easily but the other appeared stuck.  When attempting to remove the drain it snapped and part of it was left embedded in her abdominal wall.  She reports mild tenderness in the region.  No fevers, chills, nausea, vomiting, diarrhea, urinary complaints.    Home Medications Prior to Admission medications   Medication Sig Start Date End Date Taking? Authorizing Provider  drospirenone-ethinyl estradiol (YAZ) 3-0.02 MG tablet Take 1 tablet by mouth daily.   Yes [provider]  HYDROcodone-acetaminophen (NORCO/VICODIN) 5-325 MG tablet Take 1 tablet by mouth every 4 (four) hours as needed for moderate pain. 03/01/21  Yes [provider]  cyclobenzaprine (FLEXERIL) 5 MG tablet Take 1 tablet (5 mg total) by mouth every 8 (eight) hours as needed for muscle spasms. Patient not taking: Reported on 03/21/2021 08/29/18   Aviva SignsWilliams, Marie L, CNM  ibuprofen (ADVIL) 800 MG tablet Take 1 tablet (800 mg total) by mouth every 6 (six) hours. Patient not taking: Reported on 03/21/2021 08/24/18   Marcelle OverlieGrewal, Michelle, MD  oxyCODONE (OXY IR/ROXICODONE) 5 MG immediate release tablet Take 1-2 tablets (5-10 mg total) by mouth every 4 (four) hours as needed for moderate pain. Patient not taking: Reported on 03/21/2021 08/24/18   Marcelle OverlieGrewal, Michelle, MD      Allergies    Patient has no known allergies.    Review of Systems    Review of Systems  All other systems reviewed and are negative. Ten systems reviewed and are negative for acute change, except as noted in the HPI.   Physical Exam Updated Vital Signs BP 102/71 (BP Location: Left Arm)    Pulse 90    Temp 97.8 F (36.6 C)    Resp 15    SpO2 95%  Physical Exam Vitals and nursing note reviewed.  Constitutional:      General: She is not in acute distress.    Appearance: Normal appearance. She is not ill-appearing, toxic-appearing or diaphoretic.  HENT:     Head: Normocephalic and atraumatic.     Right Ear: External ear normal.     Left Ear: External ear normal.     Nose: Nose normal.     Mouth/Throat:     Mouth: Mucous membranes are moist.     Pharynx: Oropharynx is clear. No oropharyngeal exudate or posterior oropharyngeal erythema.  Eyes:     Extraocular Movements: Extraocular movements intact.  Cardiovascular:     Rate and Rhythm: Normal rate and regular rhythm.     Pulses: Normal pulses.     Heart sounds: Normal heart sounds. No murmur heard.   No friction rub. No gallop.  Pulmonary:     Effort: Pulmonary effort is normal. No respiratory distress.     Breath sounds: Normal breath sounds. No stridor. No wheezing, rhonchi or rales.  Abdominal:  General: Abdomen is flat.     Palpations: Abdomen is soft.     Tenderness: There is abdominal tenderness.     Comments: Well-healing surgical scars noted to the left lower abdomen.  Abdomen is flat and soft.  Mild tenderness noted in the left lower quadrant.  No drainage noted from the wounds.  No surrounding erythema.  Musculoskeletal:        General: Normal range of motion.     Cervical back: Normal range of motion and neck supple. No tenderness.  Skin:    General: Skin is warm and dry.  Neurological:     General: No focal deficit present.     Mental Status: She is alert and oriented to person, place, and time.  Psychiatric:        Mood and Affect: Mood normal.        Behavior: Behavior  normal.   ED Results / Procedures / Treatments   Labs (all labs ordered are listed, but only abnormal results are displayed) Labs Reviewed  CBC WITH DIFFERENTIAL/PLATELET - Abnormal; Notable for the following components:      Result Value   Hemoglobin 11.7 (*)    All other components within normal limits  COMPREHENSIVE METABOLIC PANEL - Abnormal; Notable for the following components:   ALT 46 (*)    All other components within normal limits  I-STAT BETA HCG BLOOD, ED ([facility], [facility], [facility] ONLY)    EKG None  Radiology CT ABDOMEN PELVIS WO CONTRAST  Result Date: 03/21/2021 CLINICAL DATA:  Abdominal pain. Status post "tummy tuck" 03/01/2021. Postoperative foreign body in abdomen (JP drain). Sent to urgent care for evaluation of drainage tube that had broken inside of patient. Planned removal of bilateral postoperative drains 3 days ago. Right drainage tube removed without issue left broke leaving part of the tubing under her skin. EXAM: CT ABDOMEN AND PELVIS WITHOUT CONTRAST TECHNIQUE: Multidetector CT imaging of the abdomen and pelvis was performed following the standard protocol without IV contrast. COMPARISON:  Chest two views 08/25/2018 FINDINGS: Lower chest: Lung bases are unremarkable. Lack of intravenous contrast limits evaluation of the abdominal and pelvic organ parenchyma. The following findings are made within this limitation. Hepatobiliary: Smooth liver contours. The liver and gallbladder are grossly unremarkable. Pancreas: No gross abnormality is seen. Spleen: No gross abnormality is seen. Adrenals/Urinary Tract: Adrenal glands are unremarkable. No renal stone or hydronephrosis. No gross focal urinary bladder wall thickening. Stomach/Bowel: The appendix appears within normal limits (coronal images 48 through 61). No dilated loops of bowel to indicate bowel obstruction. Vascular/Lymphatic: No abdominal aortic aneurysm. Reproductive: The uterus is present and unremarkable. No adnexal mass is seen.  Other: There is a drainage catheter in prominently a transverse orientation extending from the lateral right hemiabdomen deep subcutaneous fat overlying the transversalis abdominal musculature (axial 41/3) through the deep aspect of the subcutaneous fat to the mid transverse portion of the left hemiabdomen (axial image 61), consistent with the patient's drainage catheter that was unable to be removed completely. This appears to remain anterior to the abdominal wall musculature. The tip within the right lateral subcutaneous fat is approximally 1.1 cm from the skin surface in the tip within the mid transverse aspect of the left hemiabdomen is approximally 1.2 cm from the skin surface. There is mild-to-moderate stranding of the subcutaneous fat around the catheter and in the periumbilical region consistent with the patient's recent surgery. There is a "button" presumably of the catheter seen at the superficial aspect of the umbilicus (  sagittal image 95). There is mild extension of the ventral subcutaneous fat into the space between the rectus abdominus musculature in a region measuring up to 8 mm in transverse dimension (axial image 47). This fat measures up to approximately 10 by 21 by 73 mm (transverse by AP by craniocaudal). This presumably relates to the patient's recent abdominal fat reduction surgery. There is also mild relative enlargement/swelling of the right rectus abdominus musculature compared to the left, consistent with recent surgery. No pneumoperitoneum is seen.  No ascites. Musculoskeletal: No acute or significant osseous findings. Partial visualization of bilateral breast implants. IMPRESSION: Postsurgical changes of the patient's reported abdominoplasty surgery approximately 20 days ago. Expected postoperative stranding within the ventral subcutaneous fat with mild extension of fat in between the rectus abdominus musculature. There is a catheter in transverse orientation within the deep aspect of the  ventral subcutaneous fat extending from the right lateral abdomen through the mid transverse portion of the left hemiabdomen, as described above. This is consistent with the drainage catheter that was unable to be removed. The tip within the right lateral subcutaneous fat is approximally 1.1 cm from the skin surface in the tip within the mid transverse aspect of the left hemiabdomen is approximally 1.2 cm from the skin surface. Electronically Signed   By: Neita Garnetonald  Viola   On: 03/21/2021 13:40    Procedures Procedures   Medications Ordered in ED Medications - No data to display  ED Course/ Medical Decision Making/ A&P                           Medical Decision Making Pt is a 26 y.o. female who presents to the emergency department for evaluation of retained JP drain tubing in her abdominal wall.  She had a tummy tuck on December 20 in [place] and was told to follow-up with her PCP to have these removed.  While at urgent care today 1 was easily removed and the other broke and part of the tubing was left in place.    Labs: CBC with a hemoglobin 11.7. CMP with an ALT of 46. I-STAT beta-hCG less than 5.  Imaging: CT scan of the abdomen/pelvis without contrast shows  IMPRESSION: Postsurgical changes of the patient's reported abdominoplasty surgery approximately 20 days ago. Expected postoperative stranding within the ventral subcutaneous fat with mild extension of fat in between the rectus abdominus musculature. There is a catheter in transverse orientation within the deep aspect of the ventral subcutaneous fat extending from the right lateral abdomen through the mid transverse portion of the left hemiabdomen, as described above. This is consistent with the drainage catheter that was unable to be removed. The tip within the right lateral subcutaneous fat is approximally 1.1 cm from the skin surface in the tip within the mid transverse aspect of the left hemiabdomen is approximally 1.2 cm from the skin  surface.   I, Placido SouLogan Dayami Taitt, PA-C, personally reviewed and evaluated these images and lab results as part of my medical decision-making.  On my exam patient has mild tenderness in the left lower quadrant.  Abdomen is otherwise flat and soft.  Well-healing surgical scars without surrounding erythema.  No drainage from the wounds.  Myself as well as my attending physician are unable to visualize or palpate the retained catheter.  Do not feel that it is appropriate to attempt incision and removal of the tube in the ED tonight.  We will give patient a referral to general  surgery for reevaluation and likely removal.  Lab work obtained in triage is mostly reassuring.  Hemoglobin appears stable at 11.7.  CBC without leukocytosis.  No electrolyte derangements noted on CMP.  Patient afebrile and not tachycardic.  Doubt concomitant infectious etiology.  Feel the patient is stable for discharge at this time and she is agreeable.  We discussed return precautions.  Her questions were answered and she was amicable at the time of discharge.  Note: Portions of this report may have been transcribed using voice recognition software. Every effort was made to ensure accuracy; however, inadvertent computerized transcription errors may be present.   Final Clinical Impression(s) / ED Diagnoses Final diagnoses:  Foreign body in anterior abdominal wall   Rx / DC Orders ED Discharge Orders     None         Placido Sou, PA-C 03/21/21 2301    Cathren Laine, MD 03/21/21 2313

## 2021-03-21 NOTE — Discharge Instructions (Signed)
Below is the contact information for [facility] surgery.  Please give them a call tomorrow morning and schedule appointment for reevaluation.  If you develop any new or worsening symptoms please come back to the emergency department.

## 2023-01-26 ENCOUNTER — Ambulatory Visit (INDEPENDENT_AMBULATORY_CARE_PROVIDER_SITE_OTHER): Payer: 59 | Admitting: Adult Health

## 2023-01-26 ENCOUNTER — Encounter: Payer: Self-pay | Admitting: Adult Health

## 2023-01-26 VITALS — BP 114/72 | HR 82 | Ht 62.0 in | Wt 127.0 lb

## 2023-01-26 DIAGNOSIS — F411 Generalized anxiety disorder: Secondary | ICD-10-CM

## 2023-01-26 MED ORDER — SERTRALINE HCL 50 MG PO TABS: 50.0000 mg | ORAL_TABLET | Freq: Every day | ORAL | 2 refills | Status: DC

## 2023-01-26 NOTE — Progress Notes (Signed)
[facility] MD/PA/NP Initial Note  01/26/2023 9:56 AM TRAM NEISLER  MRN:  [id number]  Chief Complaint:   HPI:  Patient seen today for initial psychiatric evaluation.   Self-referral.  Describes mood today as "I've been better". Pleasant. Reports occasional tearfulness. Mood symptoms - denies depression - "coming out it now". 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 Reports feeling anxious - the last 3 weeks have been better - has started working with a therapist and is learning techniques and coping mechanisms to manage it. Reports irritability - "several days a week". Feels more anxious when getting overwhelmed every day. Reports a lack of structure in the home - routines. Has been working on a routine to lessen those feeling and has started asking for husband to help out. Denies panic attacks. Reports seeking assistance from the ED for recent anxiety attacks. Reports worry, rumination, and over thinking. Stating "I over think things for sure". Reports husband had an emotional affair contributing to her increase anxiety. Reports a history of abandonment issues heightening her anxiety. Reports her father went to prison when she was 3 - there were a lot of family breakdowns - not getting the "closure" she needed. Reports her anxiety is the main struggle. Recently seen in the ER for anxiety and was given referrals to psychiatry. Reports she has tried previous medications with some relief, but is currently not taking medications. She is willing to consider medication options.   Stable interest and motivation. Taking medications as prescribed.  Energy levels lower. Active, does not have a regular exercise routine.   Enjoys some usual interests and activities. Married. Lives with husband and 2 daughters 8 and 4. Spending time with family - [place]. Reports having a good support system between the families. Appetite adequate. Weight  stable. Reports broken sleep throughout the night. Averages 4 to 5 hours. Reports focus and concentration difficulties over past few years - situational. Completing tasks. Managing aspects of household. 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 Denies SI or HI.  Denies AH or VH. Denies self harm.  Denies substance use. Denies alcohol use. Working with therapist - Research officer, political party - individual and marriage counseling x 6 months. Working with a trauma therapist.   Previous medication trials:  Prozac, others  Visit Diagnosis: No diagnosis found.  Past Psychiatric History: Denies psychiatric hospitalization.   Past Medical History:  Past Medical History:  Diagnosis Date   Anxiety    Depression    History of abnormal cervical Pap smear    History of anxiety    History of panic attacks    HSV-1 (herpes simplex virus 1) infection    Hx of to R hand   Vaginal Pap smear, abnormal     Past Surgical History:  Procedure Laterality Date   CESAREAN SECTION N/A 08/22/2018   Procedure: CESAREAN SECTION;  Surgeon: Ranae Pila, MD;  Location: [facility] LD ORS;  Service: Obstetrics;  Laterality: N/A;   NO PAST SURGERIES      Family Psychiatric History: Reports family history of mental illness.   Family History:  Family History  Problem Relation Age of Onset   Diabetes Father    Hypertension Maternal Grandmother    Hypertension Maternal Grandfather     Social History:  Social History   Socioeconomic History   Marital status: Single    Spouse name: Not on file   Number of children: Not on file  Years of education: Not on file   Highest education level: Not on file  Occupational History   Not on file  Tobacco Use   Smoking status: Never   Smokeless tobacco: Never  Vaping Use   Vaping status: Never Used  Substance and Sexual Activity   Alcohol use: Not Currently   Drug use: No   Sexual activity: Not Currently    Birth control/protection: None  Other Topics Concern    Not on file  Social History Narrative   Not on file   Social Determinants of Health   Financial Resource Strain: Low Risk  (08/15/2018)   Overall Financial Resource Strain (CARDIA)    Difficulty of Paying Living Expenses: Not hard at all  Food Insecurity: No Food Insecurity (04/12/2021)   Received from [facility]   Hunger Vital Sign    Worried About Running Out of Food in the Last Year: Never true    Ran Out of Food in the Last Year: Never true  Transportation Needs: Unknown (08/15/2018)   PRAPARE - Administrator, Civil Service (Medical): No    Lack of Transportation (Non-Medical): Not on file  Physical Activity: Not on file  Stress: Stress Concern Present (08/15/2018)   [employer] of Occupational Health - Occupational Stress Questionnaire    Feeling of Stress : To some extent  Social Connections: Unknown (07/10/2021)   [facility]   Social Network    Social Network: Not on file    Allergies: No Known Allergies  Metabolic Disorder Labs: No results found for: "HGBA1C", "MPG" No results found for: "PROLACTIN" No results found for: "CHOL", "TRIG", "HDL", "CHOLHDL", "VLDL", "LDLCALC" No results found for: "TSH"  Therapeutic Level Labs: No results found for: "LITHIUM" No results found for: "VALPROATE" No results found for: "CBMZ"  Current Medications: Current Outpatient Medications  Medication Sig Dispense Refill   cyclobenzaprine (FLEXERIL) 5 MG tablet Take 1 tablet (5 mg total) by mouth every 8 (eight) hours as needed for muscle spasms. (Patient not taking: Reported on 03/21/2021) 20 tablet 0   drospirenone-ethinyl estradiol (YAZ) 3-0.02 MG tablet Take 1 tablet by mouth daily.     HYDROcodone-acetaminophen (NORCO/VICODIN) 5-325 MG tablet Take 1 tablet by mouth every 4 (four) hours as needed for moderate pain.     ibuprofen (ADVIL) 800 MG tablet Take 1 tablet (800 mg total) by mouth every 6 (six) hours. (Patient not taking: Reported on  03/21/2021) 30 tablet 0   oxyCODONE (OXY IR/ROXICODONE) 5 MG immediate release tablet Take 1-2 tablets (5-10 mg total) by mouth every 4 (four) hours as needed for moderate pain. (Patient not taking: Reported on 03/21/2021) 30 tablet 0   No current facility-administered medications for this visit.    Medication Side Effects: none  Orders placed this visit:  No orders of the defined types were placed in this encounter.   Psychiatric Specialty Exam:  Review of Systems  Musculoskeletal:  Negative for gait problem.  Neurological:  Negative for tremors.  Psychiatric/Behavioral:         Please refer to HPI    currently breastfeeding.There is no height or weight on file to calculate BMI.  General Appearance: Casual and Neat  Eye Contact:  Good  Speech:  Clear and Coherent and Normal Rate  Volume:  Normal  Mood:  Anxious  Affect:  Appropriate and Congruent  Thought Process:  Coherent and Descriptions of Associations: Intact  Orientation:  Full (Time, Place, and Person)  Thought Content: Logical  Suicidal Thoughts:  No  Homicidal Thoughts:  No  Memory:  WNL  Judgement:  Good  Insight:  Good  Psychomotor Activity:  Normal  Concentration:  Concentration: Good and Attention Span: Good  Recall:  Good  Fund of Knowledge: Good  Language: Good  Assets:  Communication Skills Desire for Improvement Financial Resources/Insurance Housing Intimacy Leisure Time Physical Health Resilience Social Support Talents/Skills Transportation Vocational/Educational  ADL's:  Intact  Cognition: WNL  Prognosis:  Good   Screenings:  Flowsheet Row ED from 03/21/2021 in [facility] Emergency Department [facility]  C-SSRS RISK CATEGORY No Risk       Receiving Psychotherapy: Yes   Treatment Plan/Recommendations:   Plan:  PDMP reviewed  Add Zoloft 50mg  daily  Consider Lexapro  RTC 4 weeks  Patient advised to contact office with any questions, adverse effects, or acute  worsening in signs and symptoms.    Time spent with patient was 40 minutes. Greater than 50% of face to face time with patient was spent on counseling and coordination of care regarding current mental health needs and therapeutic options.     Dorothyann Gibbs, NP

## 2023-02-17 ENCOUNTER — Other Ambulatory Visit: Payer: Self-pay | Admitting: Adult Health

## 2023-02-17 DIAGNOSIS — F411 Generalized anxiety disorder: Secondary | ICD-10-CM

## 2023-02-23 ENCOUNTER — Ambulatory Visit: Payer: 59 | Admitting: Adult Health

## 2023-02-23 ENCOUNTER — Encounter: Payer: Self-pay | Admitting: Adult Health

## 2023-02-23 DIAGNOSIS — F411 Generalized anxiety disorder: Secondary | ICD-10-CM | POA: Diagnosis not present

## 2023-02-23 DIAGNOSIS — G4709 Other insomnia: Secondary | ICD-10-CM | POA: Diagnosis not present

## 2023-02-23 MED ORDER — SERTRALINE HCL 50 MG PO TABS: 50.0000 mg | ORAL_TABLET | Freq: Every day | ORAL | 2 refills | Status: DC

## 2023-02-23 MED ORDER — HYDROXYZINE HCL 25 MG PO TABS: ORAL_TABLET | ORAL | 2 refills | Status: DC

## 2023-02-23 NOTE — Progress Notes (Signed)
Katherine Nunez [id number] 08-Apr-1995 27 y.o.  Subjective:   Patient ID:  Katherine Nunez is a 27 y.o. (DOB 02-18-1996) female.  Chief Complaint: No chief complaint on file.   HPI Katherine Nunez presents to the office today for follow-up of GAD and insomnia. Describes mood today as "better". Pleasant. Denies tearfulness. Mood symptoms - denies depression. Reports decreased anxiety and irritability. Reports not feeling as overwhelmed. Reports more structure in the home - routines. Denies panic attacks. Reports decreased worry, rumination, and over thinking. Stating "I am definitely feeling better". Feels like the addition of Zoloft has been helpful. Stable interest and motivation. Taking medications as prescribed.  Energy levels improved. Active, does not have a regular exercise routine.   Enjoys some usual interests and activities. Married. Lives with husband and 2 daughters 8 and 4. Spending time with family - [place]. Reports having a good support system between the families. Appetite adequate. Weight stable. Reports broken sleep throughout the night - dreaming a lot. Averages 4 to 5 hours. Willing to try a sleep aid. Reports focus and concentration is a "lot" better. Completing tasks. Managing aspects of household. 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 Denies SI or HI.  Denies AH or VH. Denies self harm.  Denies substance use. Denies alcohol use. Working with therapist - Research officer, political party - individual and marriage counseling x 6 months. Working with a trauma therapist.   Previous medication trials:  Prozac, others   Flowsheet Row ED from 03/21/2021 in [facility] at [facility]  C-SSRS RISK CATEGORY No Risk        Review of Systems:  Review of Systems  Musculoskeletal:  Negative for gait problem.  Neurological:  Negative for tremors.  Psychiatric/Behavioral:         Please refer to HPI    Medications: I have reviewed the patient's current  medications.  Current Outpatient Medications  Medication Sig Dispense Refill   hydrOXYzine (ATARAX) 25 MG tablet Take one to two tablets at bedtime. 60 tablet 2   sertraline (ZOLOFT) 50 MG tablet Take 1 tablet (50 mg total) by mouth daily. 30 tablet 2   No current facility-administered medications for this visit.    Medication Side Effects: None  Allergies: No Known Allergies  Past Medical History:  Diagnosis Date   Anxiety    Depression    History of abnormal cervical Pap smear    History of anxiety    History of panic attacks    HSV-1 (herpes simplex virus 1) infection    Hx of to R hand   Vaginal Pap smear, abnormal     Past Medical History, Surgical history, Social history, and Family history were reviewed and updated as appropriate.   Please see review of systems for further details on the patient's review from today.   Objective:   Physical Exam:  There were no vitals taken for this visit.  Physical Exam Constitutional:      General: She is not in acute distress. Musculoskeletal:        General: No deformity.  Neurological:     Mental Status: She is alert and oriented to person, place, and time.     Coordination: Coordination normal.  Psychiatric:        Attention and Perception: Attention and perception normal. She does not perceive auditory or visual hallucinations.        Mood and Affect: Affect is not labile, blunt, angry or inappropriate.  Speech: Speech normal.        Behavior: Behavior normal.        Thought Content: Thought content normal. Thought content is not paranoid or delusional. Thought content does not include homicidal or suicidal ideation. Thought content does not include homicidal or suicidal plan.        Cognition and Memory: Cognition and memory normal.        Judgment: Judgment normal.     Comments: Insight intact     Lab Review:     Component Value Date/Time   NA 137 03/21/2021 1215   K 3.6 03/21/2021 1215   CL 107  03/21/2021 1215   CO2 22 03/21/2021 1215   GLUCOSE 91 03/21/2021 1215   BUN 17 03/21/2021 1215   CREATININE 0.62 03/21/2021 1215   CALCIUM 9.2 03/21/2021 1215   PROT 7.0 03/21/2021 1215   ALBUMIN 3.7 03/21/2021 1215   AST 31 03/21/2021 1215   ALT 46 (H) 03/21/2021 1215   ALKPHOS 84 03/21/2021 1215   BILITOT 0.3 03/21/2021 1215   GFRNONAA >60 03/21/2021 1215       Component Value Date/Time   WBC 4.8 03/21/2021 1215   RBC 4.00 03/21/2021 1215   HGB 11.7 (L) 03/21/2021 1215   HCT 36.3 03/21/2021 1215   PLT 346 03/21/2021 1215   MCV 90.8 03/21/2021 1215   MCH 29.3 03/21/2021 1215   MCHC 32.2 03/21/2021 1215   RDW 12.3 03/21/2021 1215   LYMPHSABS 1.7 03/21/2021 1215   MONOABS 0.4 03/21/2021 1215   EOSABS 0.1 03/21/2021 1215   BASOSABS 0.0 03/21/2021 1215    No results found for: "POCLITH", "LITHIUM"   No results found for: "PHENYTOIN", "PHENOBARB", "VALPROATE", "CBMZ"   .res Assessment: Plan:    Treatment Plan/Recommendations:   Plan:  PDMP reviewed  Zoloft 50mg  daily  Add Hydroxyzine 25mg  - 1 to 2 at hs as needed for sleep.  RTC 4 weeks  Patient advised to contact office with any questions, adverse effects, or acute worsening in signs and symptoms.    Time spent with patient was 40 minutes. Greater than 50% of face to face time with patient was spent on counseling and coordination of care regarding current mental health needs and therapeutic options.  Diagnoses and all orders for this visit:  Other insomnia -     hydrOXYzine (ATARAX) 25 MG tablet; Take one to two tablets at bedtime.  Generalized anxiety disorder -     sertraline (ZOLOFT) 50 MG tablet; Take 1 tablet (50 mg total) by mouth daily.     Please see After Visit Summary for patient specific instructions.  No future appointments.  No orders of the defined types were placed in this encounter.   -------------------------------

## 2023-03-17 ENCOUNTER — Other Ambulatory Visit: Payer: Self-pay | Admitting: Adult Health

## 2023-03-17 DIAGNOSIS — G4709 Other insomnia: Secondary | ICD-10-CM

## 2023-03-23 ENCOUNTER — Encounter: Payer: Self-pay | Admitting: Adult Health

## 2023-03-23 ENCOUNTER — Telehealth (INDEPENDENT_AMBULATORY_CARE_PROVIDER_SITE_OTHER): Payer: 59 | Admitting: Adult Health

## 2023-03-23 DIAGNOSIS — G4700 Insomnia, unspecified: Secondary | ICD-10-CM

## 2023-03-23 DIAGNOSIS — G4709 Other insomnia: Secondary | ICD-10-CM

## 2023-03-23 DIAGNOSIS — F411 Generalized anxiety disorder: Secondary | ICD-10-CM | POA: Diagnosis not present

## 2023-03-23 MED ORDER — SERTRALINE HCL 50 MG PO TABS: 50.0000 mg | ORAL_TABLET | Freq: Every day | ORAL | 2 refills | Status: AC

## 2023-03-23 MED ORDER — HYDROXYZINE HCL 25 MG PO TABS: 25.0000 mg | ORAL_TABLET | Freq: Every day | ORAL | 2 refills | Status: AC

## 2023-03-23 NOTE — Progress Notes (Signed)
 Katherine Nunez [id number] 1995-07-18 28 y.o.  Virtual Visit via Video Note  I connected with pt @ on 03/23/23 at  9:00 AM EST by a video enabled telemedicine application and verified that I am speaking with the correct person using two identifiers.   I discussed the limitations of evaluation and management by telemedicine and the availability of in person appointments. The patient expressed understanding and agreed to proceed.  I discussed the assessment and treatment plan with the patient. The patient was provided an opportunity to ask questions and all were answered. The patient agreed with the plan and demonstrated an understanding of the instructions.   The patient was advised to call back or seek an in-person evaluation if the symptoms worsen or if the condition fails to improve as anticipated.  I provided 10 minutes of non-face-to-face time during this encounter.  The patient was located at home.  The provider was located at [facility].   Katherine LOISE Sayers, NP   Subjective:   Patient ID:  Katherine Nunez is a 28 y.o. (DOB 30-Jun-1995) female.  Chief Complaint: No chief complaint on file.   HPI Katherine Nunez presents for follow-up of GAD and insomnia. Describes mood today as ok. Pleasant. Denies tearfulness. Mood symptoms - denies depression. and irritability. Reports decreased anxiety. Reports stable interest and motivation. Reports feeling less overwhelmed. Denies panic attacks. Reports decreased worry, rumination, and over thinking. Stating I'm doing better. Feels like medications are helpful. Taking medications as prescribed.  Energy levels improved. Active, does not have a regular exercise routine.   Enjoys some usual interests and activities. Married. Lives with husband and 2 daughters 8 and 4. Spending time with family - [place]. Reports having a good support system between the families. Appetite adequate. Weight stable. Reports sleep has improved. Averages 6  hours.  Reports focus and concentration improved - a lot better. Completing tasks. Managing aspects of household. 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  Denies AH or VH. Denies self harm.  Denies substance use. Denies alcohol use. Working with therapist - Research Officer, [employer] - individual and marriage counseling x 6 months. Working with a trauma therapist.   Previous medication trials:  Prozac, others   Review of Systems:  Review of Systems  Musculoskeletal:  Negative for gait problem.  Neurological:  Negative for tremors.  Psychiatric/Behavioral:         Please refer to HPI    Medications: I have reviewed the patient's current medications.  Current Outpatient Medications  Medication Sig Dispense Refill   hydrOXYzine  (ATARAX ) 25 MG tablet TAKE 1 TO 2 TABLETS BY MOUTH AT BEDTIME 60 tablet 0   sertraline  (ZOLOFT ) 50 MG tablet Take 1 tablet (50 mg total) by mouth daily. 30 tablet 2   No current facility-administered medications for this visit.    Medication Side Effects: None  Allergies: No Known Allergies  Past Medical History:  Diagnosis Date   Anxiety    Depression    History of abnormal cervical Pap smear    History of anxiety    History of panic attacks    HSV-1 (herpes simplex virus 1) infection    Hx of to R hand   Vaginal Pap smear, abnormal     Family History  Problem Relation Age of Onset   Diabetes Father    Hypertension Maternal Grandmother    Hypertension Maternal Grandfather     Social History   Socioeconomic History   Marital status:  Single    Spouse name: Not on file   Number of children: Not on file   Years of education: Not on file   Highest education level: Not on file  Occupational History   Not on file  Tobacco Use   Smoking status: Never   Smokeless tobacco: Never  Vaping Use   Vaping status: Never Used  Substance and Sexual Activity   Alcohol use: Not Currently   Drug use: No   Sexual activity: Not  Currently    Birth control/protection: None  Other Topics Concern   Not on file  Social History Narrative   Not on file   Social Drivers of Health   Financial Resource Strain: Low Risk  (08/15/2018)   Overall Financial Resource Strain (CARDIA)    Difficulty of Paying Living Expenses: Not hard at all  Food Insecurity: Low Risk  (02/23/2023)   Received from [facility]   Hunger Vital Sign    Worried About Running Out of Food in the Last Year: Never true    Ran Out of Food in the Last Year: Never true  Transportation Needs: No Transportation Needs (02/23/2023)   Received from [facility]    In the past 12 months, has lack of reliable transportation kept you from medical appointments, meetings, work or from getting things needed for daily living? : No  Physical Activity: Not on file  Stress: Stress Concern Present (08/15/2018)   [employer] of Occupational Health - Occupational Stress Questionnaire    Feeling of Stress : To some extent  Social Connections: Unknown (07/10/2021)   Received from [facility]   Social Network    Social Network: Not on file  Intimate Partner Violence: Unknown (06/14/2021)   Received from [facility]   HITS    Physically Hurt: Not on file    Insult or Talk Down To: Not on file    Threaten Physical Harm: Not on file    Scream or Curse: Not on file    Past Medical History, Surgical history, Social history, and Family history were reviewed and updated as appropriate.   Please see review of systems for further details on the patient's review from today.   Objective:   Physical Exam:  There were no vitals taken for this visit.  Physical Exam Constitutional:      General: She is not in acute distress. Musculoskeletal:        General: No deformity.  Neurological:     Mental Status: She is alert and oriented to person, place, and time.     Coordination: Coordination normal.  Psychiatric:        Attention and Perception:  Attention and perception normal. She does not perceive auditory or visual hallucinations.        Mood and Affect: Affect is not labile, blunt, angry or inappropriate.        Speech: Speech normal.        Behavior: Behavior normal.        Thought Content: Thought content normal. Thought content is not paranoid or delusional. Thought content does not include homicidal or suicidal ideation. Thought content does not include homicidal or suicidal plan.        Cognition and Memory: Cognition and memory normal.        Judgment: Judgment normal.     Comments: Insight intact     Lab Review:     Component Value Date/Time   NA 137 03/21/2021 1215   K  3.6 03/21/2021 1215   CL 107 03/21/2021 1215   CO2 22 03/21/2021 1215   GLUCOSE 91 03/21/2021 1215   BUN 17 03/21/2021 1215   CREATININE 0.62 03/21/2021 1215   CALCIUM 9.2 03/21/2021 1215   PROT 7.0 03/21/2021 1215   ALBUMIN 3.7 03/21/2021 1215   AST 31 03/21/2021 1215   ALT 46 (H) 03/21/2021 1215   ALKPHOS 84 03/21/2021 1215   BILITOT 0.3 03/21/2021 1215   GFRNONAA >60 03/21/2021 1215       Component Value Date/Time   WBC 4.8 03/21/2021 1215   RBC 4.00 03/21/2021 1215   HGB 11.7 (L) 03/21/2021 1215   HCT 36.3 03/21/2021 1215   PLT 346 03/21/2021 1215   MCV 90.8 03/21/2021 1215   MCH 29.3 03/21/2021 1215   MCHC 32.2 03/21/2021 1215   RDW 12.3 03/21/2021 1215   LYMPHSABS 1.7 03/21/2021 1215   MONOABS 0.4 03/21/2021 1215   EOSABS 0.1 03/21/2021 1215   BASOSABS 0.0 03/21/2021 1215    No results found for: POCLITH, LITHIUM   No results found for: PHENYTOIN, PHENOBARB, VALPROATE, CBMZ   .res Assessment: Plan:    Treatment Plan/Recommendations:   Plan:  PDMP reviewed  Zoloft  50mg  daily  Add Hydroxyzine  25mg  - 1 to 2 at hs as needed for sleep.  RTC 4 weeks  Patient advised to contact office with any questions, adverse effects, or acute worsening in signs and symptoms.   Time spent with patient was 40  minutes. Greater than 50% of face to face time with patient was spent on counseling and coordination of care regarding current mental health needs and therapeutic options.  There are no diagnoses linked to this encounter.   Please see After Visit Summary for patient specific instructions.  Future Appointments  Date Time Provider Department Center  03/23/2023  9:00 AM Jadyn Brasher Nattalie, NP [facility] None    No orders of the defined types were placed in this encounter.     -------------------------------

## 2023-04-20 ENCOUNTER — Telehealth: Payer: 59 | Admitting: Adult Health

## 2023-04-20 DIAGNOSIS — Z0389 Encounter for observation for other suspected diseases and conditions ruled out: Secondary | ICD-10-CM

## 2023-04-20 NOTE — Progress Notes (Signed)
 Patient needed to r/s.

## 2023-07-26 IMAGING — CT CT ABD-PELV W/O CM
2 of 4 series · 14 of 46 positions shown, 16 images · non-contrast
Comparison: Chest two views 08/25/2018

CLINICAL DATA: Abdominal pain. Status post "tummy tuck" 03/01/2021.
Postoperative foreign body in abdomen (JP drain). Sent to [HOSPITAL] for evaluation of drainage tube that had broken inside of
patient. Planned removal of bilateral postoperative drains 3 days
ago. Right drainage tube removed without issue left broke leaving
part of the tubing under her skin.

EXAM:
CT ABDOMEN AND PELVIS WITHOUT CONTRAST
TECHNIQUE: Multidetector CT imaging of the abdomen and pelvis was performed
following the standard protocol without IV contrast.

[Series 3: a/p w/o 5mm · axial · non-contrast · 0.76mm/px · z∈[-942,-547]mm · 11 of 95 slices shown, 13 images]
[im 8/95  soft-tissue]
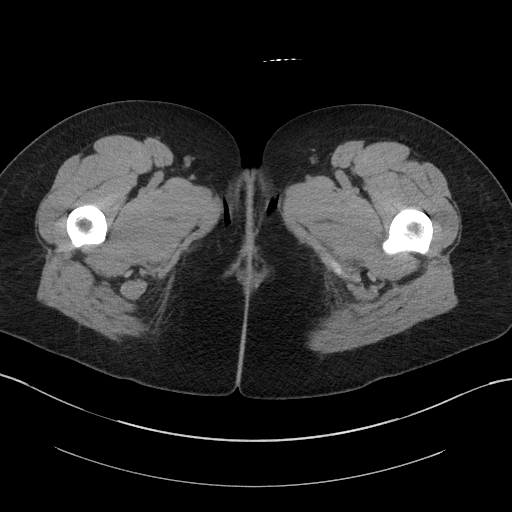
[im 8/95  bone]
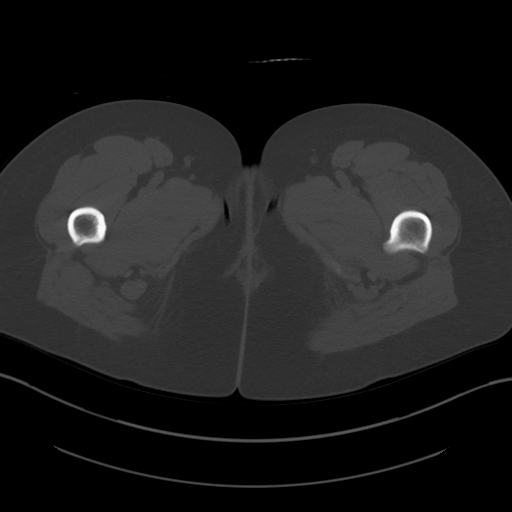
[im 16/95  soft-tissue]
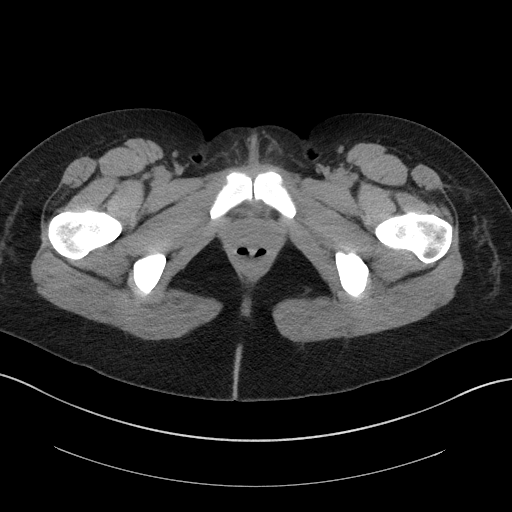
[im 23/95  soft-tissue]
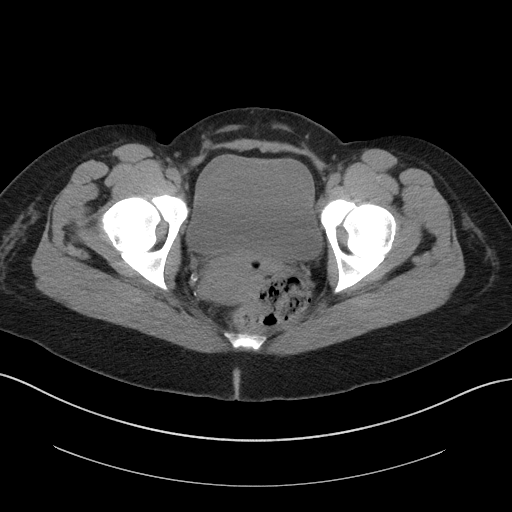
[im 31/95  soft-tissue]
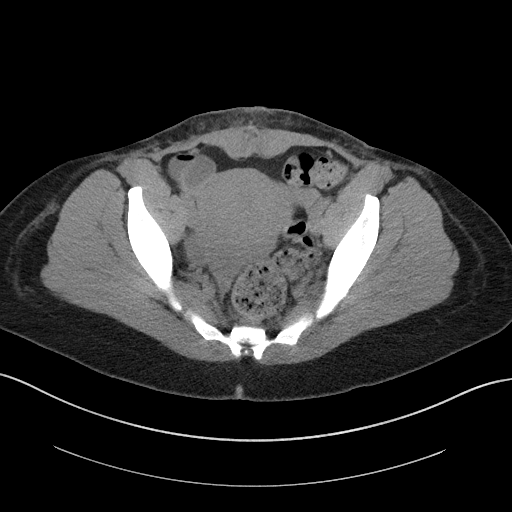
[im 38/95  soft-tissue]
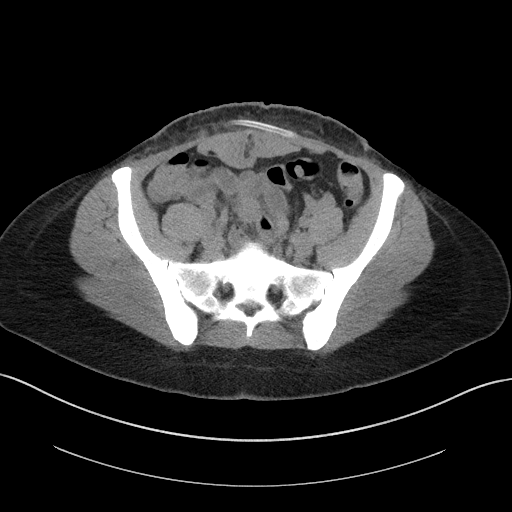
[im 49/95  soft-tissue]
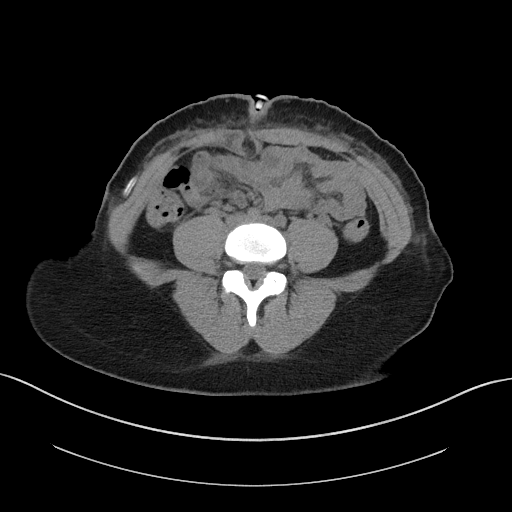
[im 57/95  soft-tissue]
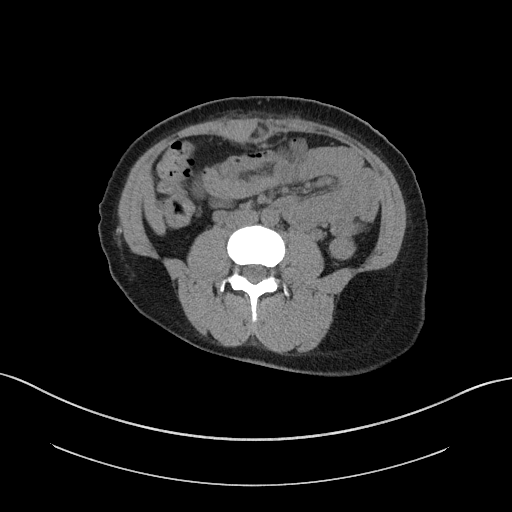
[im 64/95  soft-tissue]
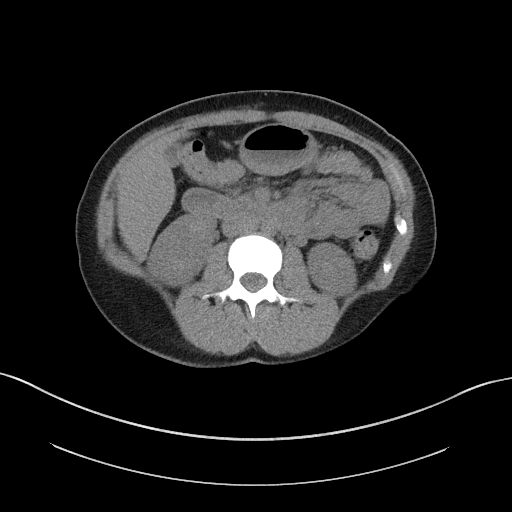
[im 72/95  soft-tissue]
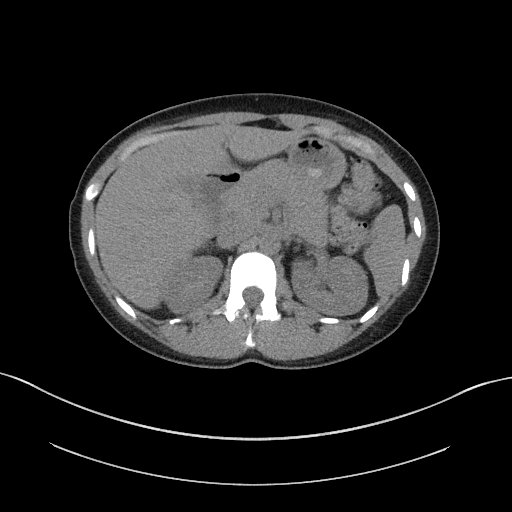
[im 72/95  bone]
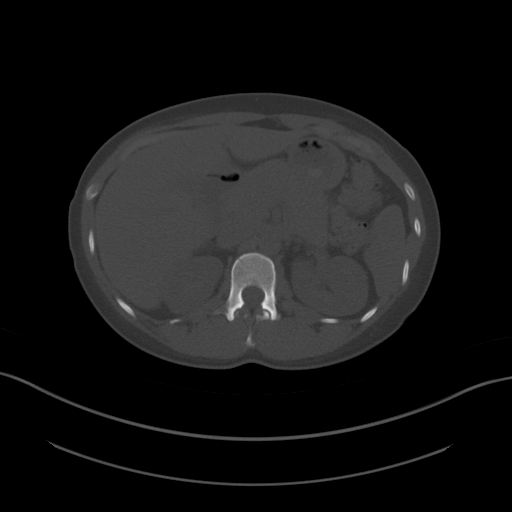
[im 79/95  soft-tissue]
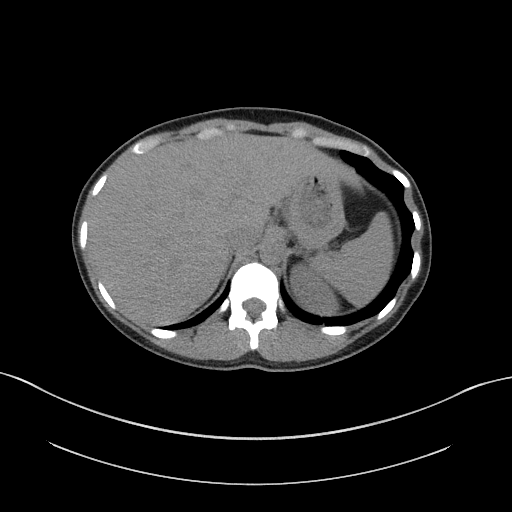
[im 87/95  soft-tissue]
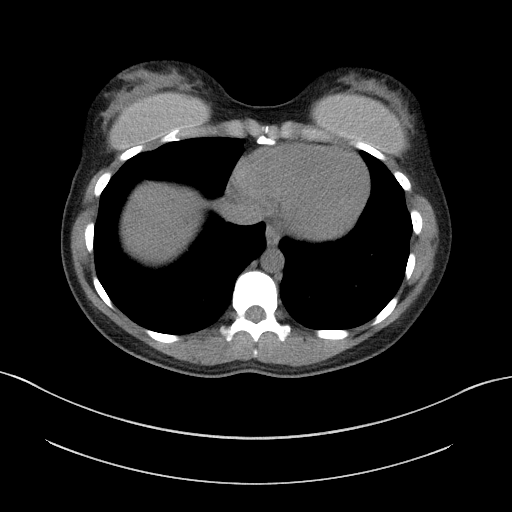

[Series 6: a/p w/o cor · coronal · non-contrast · 0.75mm/px · 3 of 135 slices shown]
[im 45/135  soft-tissue]
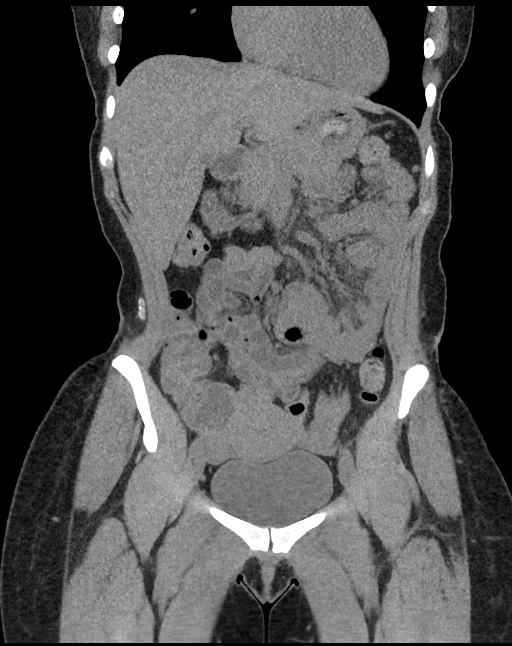
[im 60/135  soft-tissue]
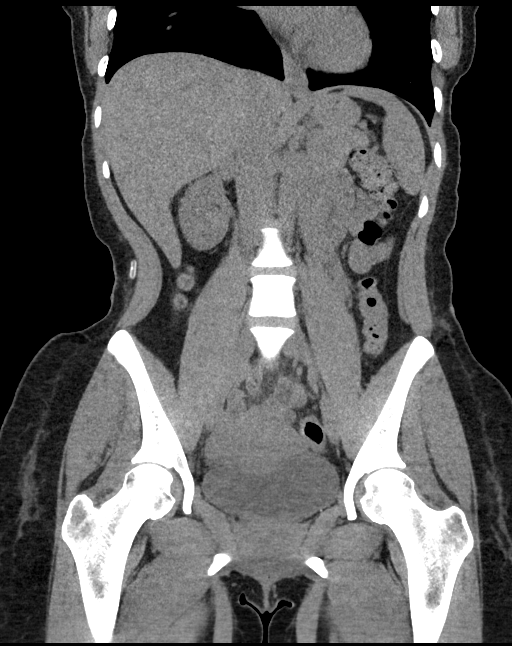
[im 75/135  soft-tissue]
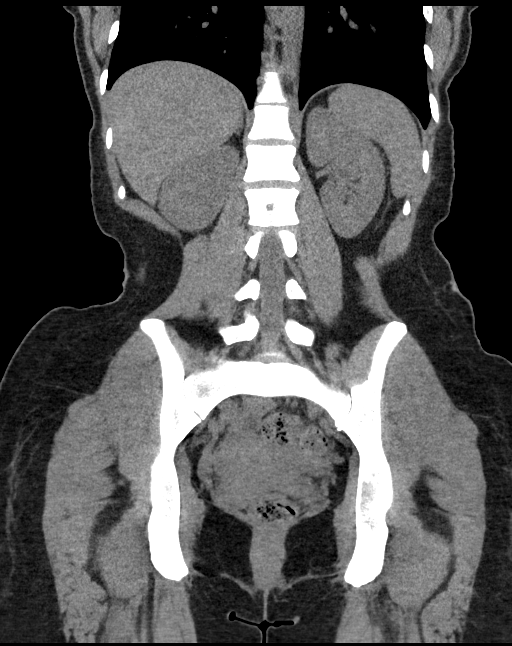

[14 of 46 positions shown; findings below may reference images not displayed]

FINDINGS: Lower chest: Lung bases are unremarkable.

Lack of intravenous contrast limits evaluation of the abdominal and
pelvic organ parenchyma. The following findings are made within this
limitation.

Hepatobiliary: Smooth liver contours. The liver and gallbladder are
grossly unremarkable.

Pancreas: No gross abnormality is seen.

Spleen: No gross abnormality is seen.

Adrenals/Urinary Tract: Adrenal glands are unremarkable. No renal
stone or hydronephrosis. No gross focal urinary bladder wall
thickening.

Stomach/Bowel:

The appendix appears within normal limits (coronal images 48 through
61). No dilated loops of bowel to indicate bowel obstruction.

Vascular/Lymphatic: No abdominal aortic aneurysm.

Reproductive: The uterus is present and unremarkable. No adnexal
mass is seen.

Other: There is a drainage catheter in prominently a transverse
orientation extending from the lateral right hemiabdomen deep
subcutaneous fat overlying the transversalis abdominal musculature
(axial 41/3) through the deep aspect of the subcutaneous fat to the
mid transverse portion of the left hemiabdomen (axial image 61),
consistent with the patient's drainage catheter that was unable to
be removed completely. This appears to remain anterior to the
abdominal wall musculature. The tip within the right lateral
subcutaneous fat is approximally 1.1 cm from the skin surface in the
tip within the mid transverse aspect of the left hemiabdomen is
approximally 1.2 cm from the skin surface.

There is mild-to-moderate stranding of the subcutaneous fat around
the catheter and in the periumbilical region consistent with the
patient's recent surgery. There is a "button" presumably of the
catheter seen at the superficial aspect of the umbilicus (sagittal
image 95). There is mild extension of the ventral subcutaneous fat
into the space between the rectus abdominus musculature in a region
measuring up to 8 mm in transverse dimension (axial image 47). This
fat measures up to approximately 10 by 21 by 73 mm (transverse by AP
by craniocaudal). This presumably relates to the patient's recent
abdominal fat reduction surgery. There is also mild relative
enlargement/swelling of the right rectus abdominus musculature
compared to the left, consistent with recent surgery.

No pneumoperitoneum is seen.  No ascites.

Musculoskeletal: No acute or significant osseous findings. Partial
visualization of bilateral breast implants.
IMPRESSION: Postsurgical changes of the patient's reported abdominoplasty
surgery approximately 20 days ago. Expected postoperative stranding
within the ventral subcutaneous fat with mild extension of fat in
between the rectus abdominus musculature. There is a catheter in
transverse orientation within the deep aspect of the ventral
subcutaneous fat extending from the right lateral abdomen through
the mid transverse portion of the left hemiabdomen, as described
above. This is consistent with the drainage catheter that was unable
to be removed. The tip within the right lateral subcutaneous fat is
approximally 1.1 cm from the skin surface in the tip within the mid
transverse aspect of the left hemiabdomen is approximally 1.2 cm
from the skin surface.

## 2023-08-14 ENCOUNTER — Encounter: Payer: Self-pay | Admitting: Physician Assistant

## 2023-08-14 ENCOUNTER — Ambulatory Visit: Payer: Self-pay | Admitting: Physician Assistant

## 2023-08-14 VITALS — BP 109/71 | HR 76 | Ht 62.0 in | Wt 160.0 lb

## 2023-08-14 DIAGNOSIS — Z0000 Encounter for general adult medical examination without abnormal findings: Secondary | ICD-10-CM

## 2023-08-14 DIAGNOSIS — R5383 Other fatigue: Secondary | ICD-10-CM

## 2023-08-14 DIAGNOSIS — E559 Vitamin D deficiency, unspecified: Secondary | ICD-10-CM

## 2023-08-14 NOTE — Progress Notes (Signed)
 New Patient Office Visit  Subjective    Patient ID: Katherine Nunez, female    DOB: [birth year]  Age: 28 y.o. MRN: [id number]  CC:  Chief Complaint  Patient presents with   Annual Exam    Patient has no concerns today.    Discussed the use of AI scribe software for clinical note transcription with the patient, who gave verbal consent to proceed.  History of Present Illness   Katherine Nunez is a 28 year old female who presents with fatigue.  She experiences persistent fatigue, feeling exhausted daily after work despite not engaging in extreme physical activities. This has been ongoing for over a year but has improved recently. She attributes some of her fatigue to anxiety and life stressors.  She sleeps through the night, waking once, and typically sleeps from 11 PM to 5 AM, totaling about six hours. She finds it challenging to go to bed earlier due to responsibilities with her children and personal time.  Previously, she experienced significant daytime anxiety and was prescribed hydroxyzine , which she no longer uses as her anxiety has improved. She has considered magnesium for sleep support.  Her water  intake includes drinking from a Stanley cup throughout the day, but she is unsure of the exact ounces consumed. She completed a course of vitamin D supplementation due to low levels detected  and has not yet started a daily regimen of 1000 IU.   Outpatient Encounter Medications as of 08/14/2023  Medication Sig   medroxyPROGESTERone  (DEPO-PROVERA ) 150 MG/ML injection Inject 150 mg into the muscle every 3 (three) months.   hydrOXYzine  (ATARAX ) 25 MG tablet Take 1-2 tablets (25-50 mg total) by mouth at bedtime. (Patient not taking: Reported on 08/14/2023)   sertraline  (ZOLOFT ) 50 MG tablet Take 1 tablet (50 mg total) by mouth daily. (Patient not taking: Reported on 08/14/2023)   No facility-administered encounter medications on file as of 08/14/2023.    Past Medical History:  Diagnosis  Date   Anxiety    Depression    History of abnormal cervical Pap smear    History of anxiety    History of panic attacks    HSV-1 (herpes simplex virus 1) infection    Hx of to R hand   Vaginal Pap smear, abnormal     Past Surgical History:  Procedure Laterality Date   CESAREAN SECTION N/A 08/22/2018   Procedure: CESAREAN SECTION;  Surgeon: Concepcion Deck, MD;  Location: [facility] LD ORS;  Service: Obstetrics;  Laterality: N/A;   NO PAST SURGERIES      Family History  Problem Relation Age of Onset   Diabetes Father    Hypertension Maternal Grandmother    Hypertension Maternal Grandfather     Social History   Socioeconomic History   Marital status: Single    Spouse name: Not on file   Number of children: Not on file   Years of education: Not on file   Highest education level: Not on file  Occupational History   Not on file  Tobacco Use   Smoking status: Never   Smokeless tobacco: Never  Vaping Use   Vaping status: Never Used  Substance and Sexual Activity   Alcohol use: Not Currently   Drug use: No   Sexual activity: Not Currently    Birth control/protection: None  Other Topics Concern   Not on file  Social History Narrative   Not on file   Social Drivers of Health   Financial Resource Strain: Low Risk  (  08/15/2018)   Overall Financial Resource Strain (CARDIA)    Difficulty of Paying Living Expenses: Not hard at all  Food Insecurity: Low Risk  (02/23/2023)   Received from [facility]   Hunger Vital Sign    Worried About Running Out of Food in the Last Year: Never true    Ran Out of Food in the Last Year: Never true  Transportation Needs: No Transportation Needs (02/23/2023)   Received from [facility]    In the past 12 months, has lack of reliable transportation kept you from medical appointments, meetings, work or from getting things needed for daily living? : No  Physical Activity: Not on file  Stress: Stress Concern Present  (08/15/2018)   [employer] of Occupational Health - Occupational Stress Questionnaire    Feeling of Stress : To some extent  Social Connections: Unknown (07/10/2021)   Received from [facility]   Social Network    Social Network: Not on file  Intimate Partner Violence: Unknown (06/14/2021)   Received from [facility]   HITS    Physically Hurt: Not on file    Insult or Talk Down To: Not on file    Threaten Physical Harm: Not on file    Scream or Curse: Not on file    Review of Systems  Constitutional:  Positive for malaise/fatigue.  HENT: Negative.    Eyes: Negative.   Respiratory:  Negative for shortness of breath.   Cardiovascular:  Negative for chest pain.  Gastrointestinal: Negative.   Genitourinary: Negative.   Musculoskeletal: Negative.   Skin: Negative.   Neurological: Negative.   Endo/Heme/Allergies: Negative.   Psychiatric/Behavioral:  The patient has insomnia.         Objective    BP 109/71 (BP Location: Left Arm, Patient Position: Sitting, Cuff Size: Large)   Pulse 76   Ht 5\' 2"  (1.575 m)   Wt 160 lb (72.6 kg)   Breastfeeding No   BMI 29.26 kg/m   Physical Exam Vitals and nursing note reviewed.    GENERAL: Alert, cooperative, well developed, no acute distress. HEENT: Normocephalic, normal oropharynx, moist mucous membranes. CHEST: Clear to auscultation bilaterally, no wheezes, rhonchi, or crackles. CARDIOVASCULAR: Normal heart rate and rhythm, S1 and S2 normal without murmurs. EXTREMITIES: No cyanosis or edema. NEUROLOGICAL: Cranial nerves grossly intact, moves all extremities without gross motor or sensory deficit.    Assessment & Plan:   Problem List Items Addressed This Visit   None Visit Diagnoses       Wellness examination    -  Primary     Other fatigue         Vitamin D deficiency         1. Wellness examination (Primary) Assessment and Plan Fatigue Chronic fatigue likely due to inadequate sleep and possible dehydration.  Anxiety and lifestyle contribute to insufficient rest. Previous vitamin D deficiency treated, current levels unknown. - Encourage 7-8 hours of sleep per night by adjusting bedtime to 10 PM. - Advise adequate hydration, aiming for 64 ounces of water  daily. - Discuss potential use of magnesium glycinate for sleep support. - Consider rechecking vitamin D levels to determine if further supplementation is needed.  Vitamin D deficiency Low vitamin D levels treated with prescription-strength vitamin D. Currently not taking daily supplementation but plans to start. - Encourage daily vitamin D supplementation with 1000 IU as previously advised.  General Health Maintenance Discussed importance of sleep, hydration, and nutrition for overall health, emphasizing her role in  managing fatigue and anxiety.  2. Other fatigue   3. Vitamin D deficiency    I have reviewed the patient's medical history (PMH, PSH, Social History, Family History, Medications, and allergies) , and have been updated if relevant. I spent 30 minutes reviewing chart and  face to face time with patient.     Return if symptoms worsen or fail to improve.   Etter Hermann Mayers, PA-C

## 2023-08-14 NOTE — Patient Instructions (Addendum)
 VISIT SUMMARY:  Today, we discussed your ongoing fatigue and its potential causes, including sleep patterns, hydration, and anxiety. We also reviewed your previous vitamin D deficiency and your current plan for supplementation. Additionally, we talked about general health maintenance, focusing on the importance of sleep, hydration, and nutrition.  YOUR PLAN:  -FATIGUE: Your chronic fatigue is likely due to not getting enough sleep and possibly not drinking enough water . Anxiety and your lifestyle may also be contributing to your tiredness. We recommend aiming for 7-8 hours of sleep each night by going to bed at 10 PM, drinking 64 ounces of water  daily, and considering magnesium glycinate to help with sleep. We may also recheck your vitamin D levels to see if you need more supplementation.  -ANXIETY: Your daytime anxiety has improved, and you are no longer using hydroxyzine . Anxiety may have previously affected your sleep. We suggest non-medication methods to manage anxiety, such as lifestyle changes and stress reduction techniques.  -VITAMIN D DEFICIENCY: You had low vitamin D levels that were treated with a high-dose prescription. You are not currently taking daily vitamin D but plan to start. We recommend taking 1000 IU of vitamin D daily as previously advised.  -GENERAL HEALTH MAINTENANCE: We discussed the importance of sleep, hydration, and nutrition for your overall health. Aim to maintain a balanced diet with 80% nutritional compliance and establish a consistent water  intake routine, such as drinking a bottle of water  when you wake up.  Fatigue If you have fatigue, you feel tired all the time and have a lack of energy or a lack of motivation. Fatigue may make it difficult to start or complete tasks because of exhaustion. Occasional or mild fatigue is often a normal response to activity or life. However, long-term (chronic) or extreme fatigue may be a symptom of a medical condition such  as: Depression. Not having enough red blood cells or hemoglobin in the blood (anemia). A problem with a small gland located in the lower front part of the neck (thyroid disorder). Rheumatologic conditions. These are problems related to the body's defense system (immune system). Infections, especially certain viral infections. Fatigue can also lead to negative health outcomes over time. Follow these instructions at home: Medicines Take over-the-counter and prescription medicines only as told by your health care provider. Take a multivitamin if told by your health care provider. Do not use herbal or dietary supplements unless they are approved by your health care provider. Eating and drinking  Avoid heavy meals in the evening. Eat a well-balanced diet, which includes lean proteins, whole grains, plenty of fruits and vegetables, and low-fat dairy products. Avoid eating or drinking too many products with caffeine in them. Avoid alcohol. Drink enough fluid to keep your urine pale yellow. Activity  Exercise regularly, as told by your health care provider. Use or practice techniques to help you relax, such as yoga, tai chi, meditation, or massage therapy. Lifestyle Change situations that cause you stress. Try to keep your work and personal schedules in balance. Do not use recreational or illegal drugs. General instructions Monitor your fatigue for any changes. Go to bed and get up at the same time every day. Avoid fatigue by pacing yourself during the day and getting enough sleep at night. Maintain a healthy weight. Contact a health care provider if: Your fatigue does not get better. You have a fever. You suddenly lose or gain weight. You have headaches. You have trouble falling asleep or sleeping through the night. You feel angry, guilty, anxious,  or sad. You have swelling in your legs or another part of your body. Get help right away if: You feel confused, feel like you might  faint, or faint. Your vision is blurry or you have a severe headache. You have severe pain in your abdomen, your back, or the area between your waist and hips (pelvis). You have chest pain, shortness of breath, or an irregular or fast heartbeat. You are unable to urinate, or you urinate less than normal. You have abnormal bleeding from the rectum, nose, lungs, nipples, or, if you are female, the vagina. You vomit blood. You have thoughts about hurting yourself or others. These symptoms may be an emergency. Get help right away. Call 911. Do not wait to see if the symptoms will go away. Do not drive yourself to the hospital. Get help right away if you feel like you may hurt yourself or others, or have thoughts about taking your own life. Go to your nearest emergency room or: Call 911. Call the National Suicide Prevention Lifeline at (347)839-6493 or 988. This is open 24 hours a day. Text the Crisis Text Line at 715 725 6950. Summary If you have fatigue, you feel tired all the time and have a lack of energy or a lack of motivation. Fatigue may make it difficult to start or complete tasks because of exhaustion. Long-term (chronic) or extreme fatigue may be a symptom of a medical condition. Exercise regularly, as told by your health care provider. Change situations that cause you stress. Try to keep your work and personal schedules in balance. This information is not intended to replace advice given to you by your health care provider. Make sure you discuss any questions you have with your health care provider. Document Revised: 12/20/2020 Document Reviewed: 12/20/2020 Elsevier Patient Education  2024 [employer].
